# Patient Record
Sex: Female | Born: 1970 | Race: Asian | Hispanic: No | Marital: Married | Smoking: Never smoker
Health system: Southern US, Community
[De-identification: ages and names within clinical notes are randomized; demographics above are authoritative.]

## PROBLEM LIST (undated history)

## (undated) ENCOUNTER — Inpatient Hospital Stay (HOSPITAL_COMMUNITY): Payer: Self-pay

## (undated) DIAGNOSIS — A749 Chlamydial infection, unspecified: Secondary | ICD-10-CM

## (undated) DIAGNOSIS — O98819 Other maternal infectious and parasitic diseases complicating pregnancy, unspecified trimester: Secondary | ICD-10-CM

## (undated) HISTORY — PX: INCISION AND DRAINAGE: SHX5863

## (undated) HISTORY — PX: OTHER SURGICAL HISTORY: SHX169

## (undated) HISTORY — PX: THYROIDECTOMY: SHX17

## (undated) HISTORY — PX: TONSILLECTOMY: SUR1361

---

## 2016-11-21 NOTE — L&D Delivery Note (Signed)
Delivery Note Pt pushed well and at 6:21 PM a viable female was delivered via Vaginal, Spontaneous Delivery (Presentation: ROA).  APGAR: 8, 9; weight: pending. Prior to delivery and as the vtx was crowning, deinfibulation was performed x approx 3-4cm using straight scissors and epidural for anesthesia; delivery effected soon after. Cord was around the shoulder at delivery; reduced, and infant placed on pt's abd and dried. Bulb suctioned mouth for sm amt of meconium secretions. Cord clamped and cut. Hospital cord blood sample collected. Placenta status: spont , intact .  Cord: 3 vessels; cord with marginal insertion approx 0.5cm from border of placenta.  Anesthesia:  Epidural Episiotomy: deinfibulation repaired in a running fashion and reapproximated with subcuticular sutures Lacerations: None Suture Repair: 3.0 monocryl Est. Blood Loss (mL): 150  Mom to postpartum.  Baby to Couplet care / Skin to Skin.  Cam HaiSHAW, Haidyn Kilburg CNM 09/20/2017, 7:30 PM  Please schedule this patient for PP visit in: 4 weeks Low risk pregnancy complicated by: AMA Delivery mode:  SVD Anticipated Birth Control:  POPs PP Procedures needed: none  Schedule Integrated BH visit: no Provider: Any provider

## 2017-02-22 ENCOUNTER — Ambulatory Visit (INDEPENDENT_AMBULATORY_CARE_PROVIDER_SITE_OTHER): Payer: BLUE CROSS/BLUE SHIELD | Admitting: Physician Assistant

## 2017-02-22 VITALS — BP 102/70 | HR 79 | Temp 98.7°F | Resp 16 | Ht 62.0 in | Wt 146.6 lb

## 2017-02-22 DIAGNOSIS — Z3201 Encounter for pregnancy test, result positive: Secondary | ICD-10-CM

## 2017-02-22 DIAGNOSIS — K5900 Constipation, unspecified: Secondary | ICD-10-CM

## 2017-02-22 DIAGNOSIS — N3001 Acute cystitis with hematuria: Secondary | ICD-10-CM | POA: Diagnosis not present

## 2017-02-22 DIAGNOSIS — R1030 Lower abdominal pain, unspecified: Secondary | ICD-10-CM

## 2017-02-22 DIAGNOSIS — R112 Nausea with vomiting, unspecified: Secondary | ICD-10-CM

## 2017-02-22 LAB — POCT URINALYSIS DIP (MANUAL ENTRY)
Bilirubin, UA: NEGATIVE
Glucose, UA: NEGATIVE
Ketones, POC UA: NEGATIVE
Nitrite, UA: NEGATIVE
Spec Grav, UA: 1.03 (ref 1.030–1.035)
Urobilinogen, UA: 0.2 (ref ?–2.0)
pH, UA: 5 (ref 5.0–8.0)

## 2017-02-22 LAB — POCT CBC
Granulocyte percent: 63.1 %G (ref 37–80)
HCT, POC: 41.3 % (ref 37.7–47.9)
Hemoglobin: 14.1 g/dL (ref 12.2–16.2)
Lymph, poc: 3 (ref 0.6–3.4)
MCH, POC: 29.3 pg (ref 27–31.2)
MCHC: 34 g/dL (ref 31.8–35.4)
MCV: 86.2 fL (ref 80–97)
MID (cbc): 0.1 (ref 0–0.9)
MPV: 8.1 fL (ref 0–99.8)
POC Granulocyte: 5.2 (ref 2–6.9)
POC LYMPH PERCENT: 36.1 % (ref 10–50)
POC MID %: 0.8 % (ref 0–12)
Platelet Count, POC: 263 10*3/uL (ref 142–424)
RBC: 4.8 M/uL (ref 4.04–5.48)
RDW, POC: 12.8 %
WBC: 8.2 10*3/uL (ref 4.6–10.2)

## 2017-02-22 LAB — POC MICROSCOPIC URINALYSIS (UMFC): Mucus: ABSENT

## 2017-02-22 LAB — POCT URINE PREGNANCY: Preg Test, Ur: POSITIVE — AB

## 2017-02-22 MED ORDER — NITROFURANTOIN MONOHYD MACRO 100 MG PO CAPS: 100.0000 mg | ORAL_CAPSULE | Freq: Two times a day (BID) | ORAL | 0 refills | Status: DC

## 2017-02-22 NOTE — Progress Notes (Signed)
Danielle Bridges  MRN: [id number] DOB: [birth year]  PCP: No PCP Per Patient  Subjective:  Pt is a 46 year old female who presents to clinic for abdominal pain x 2 weeks. She speaks Arabic - mobile interpreter used today.   Located lower abdomen. Pain is during the day and is worse at night. Described as "severe".  Started as vomiting and abdominal pain. Last episode of vomiting was 3 days ago. 1-2 episodes of vomiting.   Decreased appetite. When she eats, she feels like she will vomit.  Her bowel movements are "normal". Last BM last night. No blood in stool. She does not have to strain with BM. She has not taken anything to feel better.  LMP Feb 8. No abnormal vaginal bleeding or discharge.   Urinary changes - "not clean". Dark yellow with thick secretions.  "sometimes" fever and chills. Denies flank pain, urinary frequency, urgency and dysuria, or abnormal vaginal discharge or bleeding, vaginal cramping, back pain.  She still has her appendix.   Review of Systems  Constitutional: Positive for chills and fever. Negative for fatigue.  Respiratory: Negative for cough, shortness of breath and wheezing.   Cardiovascular: Negative for chest pain and palpitations.  Gastrointestinal: Positive for abdominal pain and vomiting. Negative for diarrhea and nausea.  Genitourinary: Negative for decreased urine volume, difficulty urinating, dysuria, enuresis, flank pain, frequency, hematuria and urgency.  Musculoskeletal: Negative for back pain.  Neurological: Negative for dizziness, weakness, light-headedness and headaches.    There are no active problems to display for this patient.   No current outpatient prescriptions on file prior to visit.   No current facility-administered medications on file prior to visit.     No Known Allergies   Objective:  BP 102/70 (BP Location: Right Arm, Patient Position: Sitting, Cuff Size: Small)   Pulse 79   Temp 98.7 F (37.1 C) (Oral)   Resp 16   Ht 5'  2" (1.575 m)   Wt 146 lb 9.6 oz (66.5 kg)   LMP 12/23/2016   SpO2 98%   BMI 26.81 kg/m   Physical Exam  Constitutional: She is oriented to person, place, and time and well-developed, well-nourished, and in no distress. No distress.  Cardiovascular: Normal rate, regular rhythm and normal heart sounds.   Abdominal: Soft. Normal appearance and bowel sounds are normal. There is generalized tenderness. There is no rigidity, no rebound, no guarding, no CVA tenderness, no tenderness at McBurney's point and negative Murphy's sign.  Neurological: She is alert and oriented to person, place, and time. GCS score is 15.  Skin: Skin is warm and dry.  Psychiatric: Mood, memory, affect and judgment normal.  Vitals reviewed.  Results for orders placed or performed in visit on 02/22/17  POCT Microscopic Urinalysis ([facility])  Result Value Ref Range   WBC,UR,HPF,POC Moderate (A) None WBC/hpf   RBC,UR,HPF,POC None None RBC/hpf   Bacteria Many (A) None, Too numerous to count   Mucus Absent Absent   Epithelial Cells, UR Per Microscopy Moderate (A) None, Too numerous to count cells/hpf  POCT urinalysis dipstick  Result Value Ref Range   Color, UA yellow yellow   Clarity, UA cloudy (A) clear   Glucose, UA negative negative   Bilirubin, UA negative negative   Ketones, POC UA negative negative   Spec Grav, UA 1.030 1.030 - 1.035   Blood, UA moderate (A) negative   pH, UA 5.0 5.0 - 8.0   Protein Ur, POC trace (A) negative   Urobilinogen, UA 0.2  Negative - 2.0   Nitrite, UA Negative Negative   Leukocytes, UA small (1+) (A) Negative  POCT urine pregnancy  Result Value Ref Range   Preg Test, Ur Positive (A) Negative    Assessment and Plan :  1. Positive pregnancy test 2. Nausea and vomiting, intractability of vomiting not specified, unspecified vomiting type 3. Lower abdominal pain - Ambulatory referral to Obstetrics / Gynecology - Urine culture - POCT Microscopic Urinalysis ([facility]) - POCT urinalysis  dipstick - POCT CBC - CMP14+EGFR - POCT urine pregnancy - Pt is positive for pregnancy. Discussed these results through interpreter. Suspect nausea due to pregnancy. Will refer to OB-GYN for f/u. Advised folic acid and increase water intake daily.   4. Acute cystitis with hematuria  - nitrofurantoin, macrocrystal-monohydrate, (MACROBID) 100 MG capsule; Take 1 capsule (100 mg total) by mouth 2 (two) times daily.  Dispense: 20 capsule; Refill: 0 - Culture is pending. Will treat for UTI. RTC if symptoms do not improve.  5. Constipation - supportive care: Push fluids, add fiber and exercise to daily routine.    Mercer Pod, PA-C  [facility] [place] 02/22/2017 12:50 PM

## 2017-02-22 NOTE — Patient Instructions (Addendum)
Your pregnancy test is positive. I referred you to [facility] for follow-up. Please schedule an appointment when they call.  See below for constipation relief.   Take 1 mg Folic acid daily. Limit alcohol. Continue usual activities, including sports. Make sure you are eating a well balanced diet and drinking 64 oz water every day.  Enjoy having sex, as stress can make conception more difficult. Consider What to Expect When You're Expecting or Your Pregnancy Week by Week, both of these have sections on planning your next pregnancy.   Constipation, Adult Constipation is when a person has fewer bowel movements in a week than normal, has difficulty having a bowel movement, or has stools that are dry, hard, or larger than normal. Constipation may be caused by an underlying condition. It may become worse with age if a person takes certain medicines and does not take in enough fluids. Follow these instructions at home: Eating and drinking    Eat foods that have a lot of fiber, such as fresh fruits and vegetables, whole grains, and beans.  Limit foods that are high in fat, low in fiber, or overly processed, such as french fries, hamburgers, cookies, candies, and soda.  Drink enough fluid to keep your urine clear or pale yellow. General instructions   Exercise regularly or as told by your health care provider.  Go to the restroom when you have the urge to go. Do not hold it in.  Take over-the-counter and prescription medicines only as told by your health care provider. These include any fiber supplements.  Practice pelvic floor retraining exercises, such as deep breathing while relaxing the lower abdomen and pelvic floor relaxation during bowel movements.  Watch your condition for any changes.  Keep all follow-up visits as told by your health care provider. This is important. Contact a health care provider if:  You have pain that gets worse.  You have a fever.  You do not have a bowel movement  after 4 days.  You vomit.  You are not hungry.  You lose weight.  You are bleeding from the anus.  You have thin, pencil-like stools. Get help right away if:  You have a fever and your symptoms suddenly get worse.  You leak stool or have blood in your stool.  Your abdomen is bloated.  You have severe pain in your abdomen.  You feel dizzy or you faint. This information is not intended to replace advice given to you by your health care provider. Make sure you discuss any questions you have with your health care provider. Document Released: 08/05/2004 Document Revised: 05/27/2016 Document Reviewed: 04/27/2016 Elsevier Interactive Patient Education  2017 [place] you for coming in today. I hope you feel we met your needs.  Feel free to call [facility] if you have any questions or further requests.  Please consider signing up for MyChart if you do not already have it, as this is a great way to communicate with me.  Best,  Whitney McVey, PA-C   IF you received an x-ray today, you will receive an invoice from [facility] Radiology. Please contact [facility] at [phone] with questions or concerns regarding your invoice.   IF you received labwork today, you will receive an invoice from [facility]. Please contact [facility] at [phone] with questions or concerns regarding your invoice.   Our billing staff will not be able to assist you with questions regarding bills from these companies.  You will be contacted with the lab results as  soon as they are available. The fastest way to get your results is to activate your My Chart account. Instructions are located on the last page of this paperwork. If you have not heard from [place] regarding the results in 2 weeks, please contact this office.

## 2017-02-23 LAB — CMP14+EGFR
ALT: 10 IU/L (ref 0–32)
AST: 11 IU/L (ref 0–40)
Albumin/Globulin Ratio: 1.5 (ref 1.2–2.2)
Albumin: 3.7 g/dL (ref 3.5–5.5)
Alkaline Phosphatase: 50 IU/L (ref 39–117)
BUN/Creatinine Ratio: 8 — ABNORMAL LOW (ref 9–23)
BUN: 5 mg/dL — ABNORMAL LOW (ref 6–24)
Bilirubin Total: 0.2 mg/dL (ref 0.0–1.2)
CO2: 21 mmol/L (ref 18–29)
Calcium: 9.2 mg/dL (ref 8.7–10.2)
Chloride: 105 mmol/L (ref 96–106)
Creatinine, Ser: 0.65 mg/dL (ref 0.57–1.00)
GFR calc Af Amer: 126 mL/min/{1.73_m2} (ref >59)
GFR calc non Af Amer: 109 mL/min/{1.73_m2} (ref >59)
Globulin, Total: 2.4 g/dL (ref 1.5–4.5)
Glucose: 83 mg/dL (ref 65–99)
Potassium: 4.2 mmol/L (ref 3.5–5.2)
Sodium: 139 mmol/L (ref 134–144)
Total Protein: 6.1 g/dL (ref 6.0–8.5)

## 2017-02-24 LAB — URINE CULTURE

## 2017-03-01 ENCOUNTER — Encounter: Payer: Self-pay | Admitting: Medical

## 2017-03-25 ENCOUNTER — Encounter (HOSPITAL_COMMUNITY): Payer: Self-pay

## 2017-03-25 ENCOUNTER — Inpatient Hospital Stay (HOSPITAL_COMMUNITY)
Admission: AD | Admit: 2017-03-25 | Discharge: 2017-03-26 | Disposition: A | Discharge status: Home / Self Care | Payer: Medicaid Other | Source: Ambulatory Visit | Attending: Obstetrics and Gynecology | Admitting: Obstetrics and Gynecology

## 2017-03-25 DIAGNOSIS — J029 Acute pharyngitis, unspecified: Secondary | ICD-10-CM | POA: Diagnosis not present

## 2017-03-25 DIAGNOSIS — J00 Acute nasopharyngitis [common cold]: Secondary | ICD-10-CM

## 2017-03-25 DIAGNOSIS — Z3A12 12 weeks gestation of pregnancy: Secondary | ICD-10-CM | POA: Diagnosis present

## 2017-03-25 DIAGNOSIS — O99511 Diseases of the respiratory system complicating pregnancy, first trimester: Secondary | ICD-10-CM | POA: Diagnosis not present

## 2017-03-25 DIAGNOSIS — O26891 Other specified pregnancy related conditions, first trimester: Secondary | ICD-10-CM | POA: Diagnosis not present

## 2017-03-25 DIAGNOSIS — M545 Low back pain: Secondary | ICD-10-CM | POA: Diagnosis not present

## 2017-03-25 DIAGNOSIS — Z9889 Other specified postprocedural states: Secondary | ICD-10-CM | POA: Diagnosis not present

## 2017-03-25 DIAGNOSIS — N3001 Acute cystitis with hematuria: Secondary | ICD-10-CM

## 2017-03-25 DIAGNOSIS — R0602 Shortness of breath: Secondary | ICD-10-CM | POA: Diagnosis not present

## 2017-03-25 DIAGNOSIS — R51 Headache: Secondary | ICD-10-CM | POA: Insufficient documentation

## 2017-03-25 DIAGNOSIS — O219 Vomiting of pregnancy, unspecified: Secondary | ICD-10-CM | POA: Insufficient documentation

## 2017-03-25 DIAGNOSIS — Z331 Pregnant state, incidental: Secondary | ICD-10-CM | POA: Diagnosis not present

## 2017-03-25 DIAGNOSIS — O2311 Infections of bladder in pregnancy, first trimester: Secondary | ICD-10-CM | POA: Diagnosis not present

## 2017-03-25 LAB — URINALYSIS, ROUTINE W REFLEX MICROSCOPIC
Bilirubin Urine: NEGATIVE
GLUCOSE, UA: 50 mg/dL — AB
KETONES UR: 20 mg/dL — AB
NITRITE: NEGATIVE
PROTEIN: NEGATIVE mg/dL
Specific Gravity, Urine: 1.017 (ref 1.005–1.030)
pH: 5 (ref 5.0–8.0)

## 2017-03-25 MED ORDER — PROMETHAZINE HCL 25 MG PO TABS
25.0000 mg | ORAL_TABLET | Freq: Once | ORAL | Status: AC
  Administered 2017-03-25: 25 mg via ORAL
  Filled 2017-03-25: qty 1

## 2017-03-25 MED ORDER — PSEUDOEPHEDRINE HCL 30 MG PO TABS
60.0000 mg | ORAL_TABLET | Freq: Once | ORAL | Status: AC
  Administered 2017-03-25: 60 mg via ORAL
  Filled 2017-03-25: qty 2

## 2017-03-25 MED ORDER — ACETAMINOPHEN 500 MG PO TABS
1000.0000 mg | ORAL_TABLET | Freq: Once | ORAL | Status: AC
  Administered 2017-03-25: 1000 mg via ORAL
  Filled 2017-03-25: qty 2

## 2017-03-25 NOTE — MAU Note (Signed)
Period didn't come so took home UPT on April 4th, went to [facility] and it was positive.  Started having  shortnessof breath and having pain in lower back. Feels congested in throat and nose and has headache since Thursday. Lower abd pain since beginnng of pregnancy like stomach ache, no abd pain today.  Vomits once a day. Has doctor appt on May 9th to start care, doesn't know name of place. No vaginal bleeding.

## 2017-03-25 NOTE — Progress Notes (Addendum)
G4P3 @ approx 12.[redacted] wksga. Presents to triage for SOB, back pain, and sinus congestion. Symptoms started on Thursday. Did not take any medications. Been able to take fluids and eat. Threw up once this morning only. denies bleeding or vaginal discharges.   Doppler 162 VSS see flow sheet for details.   2257: Provider at bs assessing pt. Arabic interpreter called for assistance.   Influenza test ordered.   2314: reported off to [employer] [facility].

## 2017-03-25 NOTE — MAU Provider Note (Signed)
Chief Complaint: congestion, SOB, HA   First Provider Initiated Contact with Patient 03/25/17 2255     SUBJECTIVE HPI: Danielle Bridges is a 46 y.o. G4P3003 at 5178w2d who presents to [facility] reporting nasal congestion, sore throat, chills, headache, low back pain, shortness of breath since Thursday, 03/23/2017. Hasn't taken anything for the symptoms. Denies sick contacts. He was diagnosed with UTI 02/22/2017. Was prescribed Macrobid, but couldn't take the medication due to nausea and vomiting of pregnancy. Reports she still has nausea and vomits 1 time per day. Denies dysuria, hematuria, urgency, frequency. Has not checked temperature.  Location: Generalized headache and low back pain Quality: Throbbing Severity: Moderate Duration: 2 days Context: Symptoms of respiratory infection Timing: Constant Modifying factors: None. Hasn't tried anything for the pain Associated signs and symptoms: Positive for chills, nasal congestion, sore throat. Negative for urinary complaints, flank pain.  History reviewed. No pertinent past medical history. OB History  Gravida Para Term Preterm AB Living  4 3 3     3   SAB TAB Ectopic Multiple Live Births          3    # Outcome Date GA Lbr Len/2nd Weight Sex Delivery Anes PTL Lv  4 Current           3 Term      Vag-Spont   LIV  2 Term      Vag-Spont   LIV  1 Term      Vag-Spont   LIV     Past Surgical History:  Procedure Laterality Date  . THYROIDECTOMY Bilateral    Social History   Social History  . Marital status: Married    Spouse name: N/A  . Number of children: N/A  . Years of education: N/A   Occupational History  . Not on file.   Social History Main Topics  . Smoking status: Never Smoker  . Smokeless tobacco: Never Used  . Alcohol use No  . Drug use: No  . Sexual activity: Yes     Comment: married   Other Topics Concern  . Not on file   Social History Narrative  . No narrative on file   Family History  Problem  Relation Age of Onset  . Hypertension Mother    No current facility-administered medications on file prior to encounter.    No current outpatient prescriptions on file prior to encounter.   No Known Allergies  I have reviewed patient's Past Medical Hx, Surgical Hx, Family Hx, Social Hx, medications and allergies.   Review of Systems  Constitutional: Positive for chills and fatigue. Negative for appetite change.  HENT: Positive for congestion and rhinorrhea.   Eyes: Negative for visual disturbance.  Respiratory: Positive for shortness of breath. Negative for cough, chest tightness and wheezing.   Gastrointestinal: Positive for nausea and vomiting. Negative for abdominal pain.  Genitourinary: Negative for dysuria, flank pain, frequency, hematuria, urgency and vaginal bleeding.  Musculoskeletal: Positive for back pain and myalgias. Negative for neck pain.  Neurological: Positive for headaches. Negative for speech difficulty.    OBJECTIVE Patient Vitals for the past 24 hrs:  BP Temp Temp src Pulse Resp SpO2 Height Weight  03/25/17 2205 117/64 98.7 F (37.1 C) Oral (!) 109 18 99 % 5\' 3"  (1.6 m) 143 lb 12 oz (65.2 kg)   Constitutional: Well-developed, well-nourished female in mild distress. Appears older than stated age. Mildly ill-appearing. Cardiovascular: Mild tachycardia. Regular rhythm. No murmurs rubs or gallops. Respiratory: normal rate and effort. Clear to  auscultation bilaterally. GI: Abd soft, non-tender. MS: Extremities nontender, no edema, normal ROM. Entire back mildly tender. Normal range of motion. Neurologic: Alert and oriented x 4.  GU: Neg CVAT.  SPECULUM EXAM: Deferred  LAB RESULTS Results for orders placed or performed during the hospital encounter of 03/25/17 (from the past 24 hour(s))  Urinalysis, Routine w reflex microscopic     Status: Abnormal   Collection Time: 03/25/17 10:08 PM  Result Value Ref Range   Color, Urine YELLOW YELLOW   APPearance HAZY (A)  CLEAR   Specific Gravity, Urine 1.017 1.005 - 1.030   pH 5.0 5.0 - 8.0   Glucose, UA 50 (A) NEGATIVE mg/dL   Hgb urine dipstick MODERATE (A) NEGATIVE   Bilirubin Urine NEGATIVE NEGATIVE   Ketones, ur 20 (A) NEGATIVE mg/dL   Protein, ur NEGATIVE NEGATIVE mg/dL   Nitrite NEGATIVE NEGATIVE   Leukocytes, UA SMALL (A) NEGATIVE   RBC / HPF 0-5 0 - 5 RBC/hpf   WBC, UA 6-30 0 - 5 WBC/hpf   Bacteria, UA MANY (A) NONE SEEN   Squamous Epithelial / LPF 6-30 (A) NONE SEEN   Mucous PRESENT    Hyaline Casts, UA PRESENT   Influenza panel by PCR (type A & B)     Status: None   Collection Time: 03/25/17 11:30 PM  Result Value Ref Range   Influenza A By PCR NEGATIVE NEGATIVE   Influenza B By PCR NEGATIVE NEGATIVE    IMAGING No results found.  MAU COURSE Orders Placed This Encounter  Procedures  . Culture, OB Urine  . Urinalysis, Routine w reflex microscopic  . Influenza panel by PCR (type A & B)  . Droplet precaution  . Discharge patient   Meds ordered this encounter  Medications  . OVER THE COUNTER MEDICATION    Sig: Folic acid daily.  . pseudoephedrine (SUDAFED) tablet 60 mg  . acetaminophen (TYLENOL) tablet 1,000 mg  . promethazine (PHENERGAN) tablet 25 mg  . nitrofurantoin, macrocrystal-monohydrate, (MACROBID) 100 MG capsule    Sig: Take 1 capsule (100 mg total) by mouth 2 (two) times daily.    Dispense:  14 capsule    Refill:  0    Order Specific Question:   Supervising Provider    Answer:   Tilda Burrow [[id number]]  . Prenat w/o A Vit-FeFum-FePo-FA (CONCEPT OB) 130-92.4-1 MG CAPS    Sig: Take 1 tablet by mouth daily.    Dispense:  30 capsule    Refill:  12    Order Specific Question:   Supervising Provider    Answer:   Tilda Burrow [[id number]]   Feeling much better after Tylenol and Sudafed. Influenza test negative.  MDM - Respiratory complaints due to viral URI. No evidence of influenza or emergent condition. Feeling much better after Sudafed and Tylenol. - UA still  suggestive of UTI. Patient's low back pain is not consistent with CVA tenderness. Low suspicion for pyelonephritis. We'll treat with Macrobid and give Rx Phenergan so the patient can keep down her antibiotic. - Nausea and vomiting of pregnancy controlled by Phenergan.  ASSESSMENT 1. Acute nasopharyngitis   2. Pregnancy, incidental   3. Acute cystitis with hematuria   4.      Nausea and vomiting of pregnancy  PLAN Discharge home in stable condition. URI and second trimester precautions Pilot precautions. Increase fluids and rest Urine culture pending. Follow-up Information    [facility] Follow up on 03/29/2017.   Specialty:  Obstetrics and Gynecology  Why:  Start prenatal care Contact information: [address] [place] [address] [phone]       [facility] MATERNITY ADMISSIONS Follow up.   Why:  For pregnancy emergencies Contact information: [address] [id number] mc [place] [address] [phone]       [facility] EMERGENCY DEPARTMENT Follow up.   Specialty:  Emergency Medicine Why:  For emergencies not related to pregnancy Contact information: [address] [id number] mc [place] [address] [phone]         Allergies as of 03/26/2017   No Known Allergies     Medication List    TAKE these medications   CONCEPT OB 130-92.4-1 MG Caps Take 1 tablet by mouth daily.   nitrofurantoin (macrocrystal-monohydrate) 100 MG capsule Commonly known as:  MACROBID Take 1 capsule (100 mg total) by mouth 2 (two) times daily.   OVER THE COUNTER MEDICATION Folic acid daily.        Katrinka Blazing, [place], [place] 03/26/2017  12:37 AM

## 2017-03-26 DIAGNOSIS — J00 Acute nasopharyngitis [common cold]: Secondary | ICD-10-CM

## 2017-03-26 DIAGNOSIS — N3001 Acute cystitis with hematuria: Secondary | ICD-10-CM

## 2017-03-26 DIAGNOSIS — Z331 Pregnant state, incidental: Secondary | ICD-10-CM

## 2017-03-26 LAB — INFLUENZA PANEL BY PCR (TYPE A & B)
INFLAPCR: NEGATIVE
INFLBPCR: NEGATIVE

## 2017-03-26 MED ORDER — PROMETHAZINE HCL 25 MG PO TABS: 25.0000 mg | ORAL_TABLET | Freq: Four times a day (QID) | ORAL | 1 refills | Status: DC | PRN

## 2017-03-26 MED ORDER — NITROFURANTOIN MONOHYD MACRO 100 MG PO CAPS: 100.0000 mg | ORAL_CAPSULE | Freq: Two times a day (BID) | ORAL | 0 refills | Status: DC

## 2017-03-26 MED ORDER — CONCEPT OB 130-92.4-1 MG PO CAPS: 1.0000 | ORAL_CAPSULE | Freq: Every day | ORAL | 12 refills | Status: DC

## 2017-03-26 NOTE — Discharge Instructions (Signed)
Upper Respiratory Infection, Adult Most upper respiratory infections (URIs) are a viral infection of the air passages leading to the lungs. A URI affects the nose, throat, and upper air passages. The most common type of URI is nasopharyngitis and is typically referred to as "the common cold." URIs run their course and usually go away on their own. Most of the time, a URI does not require medical attention, but sometimes a bacterial infection in the upper airways can follow a viral infection. This is called a secondary infection. Sinus and middle ear infections are common types of secondary upper respiratory infections. Bacterial pneumonia can also complicate a URI. A URI can worsen asthma and chronic obstructive pulmonary disease (COPD). Sometimes, these complications can require emergency medical care and may be life threatening. What are the causes? Almost all URIs are caused by viruses. A virus is a type of germ and can spread from one person to another. What increases the risk? You may be at risk for a URI if:  You smoke.  You have chronic heart or lung disease.  You have a weakened defense (immune) system.  You are very young or very old.  You have nasal allergies or asthma.  You work in crowded or poorly ventilated areas.  You work in health care facilities or schools. What are the signs or symptoms? Symptoms typically develop 2-3 days after you come in contact with a cold virus. Most viral URIs last 7-10 days. However, viral URIs from the influenza virus (flu virus) can last 14-18 days and are typically more severe. Symptoms may include:  Runny or stuffy (congested) nose.  Sneezing.  Cough.  Sore throat.  Headache.  Fatigue.  Fever.  Loss of appetite.  Pain in your forehead, behind your eyes, and over your cheekbones (sinus pain).  Muscle aches. How is this diagnosed? Your health care provider may diagnose a URI by:  Physical exam.  Tests to check that your  symptoms are not due to another condition such as:  Strep throat.  Sinusitis.  Pneumonia.  Asthma. How is this treated? A URI goes away on its own with time. It cannot be cured with medicines, but medicines may be prescribed or recommended to relieve symptoms. Medicines may help:  Reduce your fever.  Reduce your cough.  Relieve nasal congestion. Follow these instructions at home:  Take medicines only as directed by your health care provider.  Gargle warm saltwater or take cough drops to comfort your throat as directed by your health care provider.  Use a warm mist humidifier or inhale steam from a shower to increase air moisture. This may make it easier to breathe.  Drink enough fluid to keep your urine clear or pale yellow.  Eat soups and other clear broths and maintain good nutrition.  Rest as needed.  Return to work when your temperature has returned to normal or as your health care provider advises. You may need to stay home longer to avoid infecting others. You can also use a face mask and careful hand washing to prevent spread of the virus.  Increase the usage of your inhaler if you have asthma.  Do not use any tobacco products, including cigarettes, chewing tobacco, or electronic cigarettes. If you need help quitting, ask your health care provider. How is this prevented? The best way to protect yourself from getting a cold is to practice good hygiene.  Avoid oral or hand contact with people with cold symptoms.  Wash your hands often if contact  occurs. There is no clear evidence that vitamin C, vitamin E, echinacea, or exercise reduces the chance of developing a cold. However, it is always recommended to get plenty of rest, exercise, and practice good nutrition. Contact a health care provider if:  You are getting worse rather than better.  Your symptoms are not controlled by medicine.  You have chills.  You have worsening shortness of breath.  You have brown  or red mucus.  You have yellow or brown nasal discharge.  You have pain in your face, especially when you bend forward.  You have a fever.  You have swollen neck glands.  You have pain while swallowing.  You have white areas in the back of your throat. Get help right away if:  You have severe or persistent:  Headache.  Ear pain.  Sinus pain.  Chest pain.  You have chronic lung disease and any of the following:  Wheezing.  Prolonged cough.  Coughing up blood.  A change in your usual mucus.  You have a stiff neck.  You have changes in your:  Vision.  Hearing.  Thinking.  Mood. This information is not intended to replace advice given to you by your health care provider. Make sure you discuss any questions you have with your health care provider. Document Released: 05/03/2001 Document Revised: 07/10/2016 Document Reviewed: 02/12/2014 Elsevier Interactive Patient Education  2017 [employer]   Prenatal Care WHAT IS PRENATAL CARE? Prenatal care is the process of caring for a pregnant woman before she gives birth. Prenatal care makes sure that she and her baby remain as healthy as possible throughout pregnancy. Prenatal care may be provided by a midwife, family practice health care provider, or a childbirth and pregnancy specialist (obstetrician). Prenatal care may include physical examinations, testing, treatments, and education on nutrition, lifestyle, and social support services. WHY IS PRENATAL CARE SO IMPORTANT? Early and consistent prenatal care increases the chance that you and your baby will remain healthy throughout your pregnancy. This type of care also decreases a babys risk of being born too early (prematurely), or being born smaller than expected (small for gestational age). Any underlying medical conditions you may have that could pose a risk during your pregnancy are discussed during prenatal care visits. You will also be monitored regularly for any  new conditions that may arise during your pregnancy so they can be treated quickly and effectively. WHAT HAPPENS DURING PRENATAL CARE VISITS? Prenatal care visits may include the following: Discussion Tell your health care provider about any new signs or symptoms you have experienced since your last visit. These might include:  Nausea or vomiting.  Increased or decreased level of energy.  Difficulty sleeping.  Back or leg pain.  Weight changes.  Frequent urination.  Shortness of breath with physical activity.  Changes in your skin, such as the development of a rash or itchiness.  Vaginal discharge or bleeding.  Feelings of excitement or nervousness.  Changes in your babys movements. You may want to write down any questions or topics you want to discuss with your health care provider and bring them with you to your appointment. Examination During your first prenatal care visit, you will likely have a complete physical exam. Your health care provider will often examine your vagina, cervix, and the position of your uterus, as well as check your heart, lungs, and other body systems. As your pregnancy progresses, your health care provider will measure the size of your uterus and your babys position inside your  uterus. He or she may also examine you for early signs of labor. Your prenatal visits may also include checking your blood pressure and, after about 10-12 weeks of pregnancy, listening to your babys heartbeat. Testing Regular testing often includes:  Urinalysis. This checks your urine for glucose, protein, or signs of infection.  Blood count. This checks the levels of white and red blood cells in your body.  Tests for sexually transmitted infections (STIs). Testing for STIs at the beginning of pregnancy is routinely done and is required in many states.  Antibody testing. You will be checked to see if you are immune to certain illnesses, such as rubella, that can affect a  developing fetus.  Glucose screen. Around 24-28 weeks of pregnancy, your blood glucose level will be checked for signs of gestational diabetes. Follow-up tests may be recommended.  Group B strep. This is a bacteria that is commonly found inside a womans vagina. This test will inform your health care provider if you need an antibiotic to reduce the amount of this bacteria in your body prior to labor and childbirth.  Ultrasound. Many pregnant women undergo an ultrasound screening around 18-20 weeks of pregnancy to evaluate the health of the fetus and check for any developmental abnormalities.  HIV (human immunodeficiency virus) testing. Early in your pregnancy, you will be screened for HIV. If you are at high risk for HIV, this test may be repeated during your third trimester of pregnancy. You may be offered other testing based on your age, personal or family medical history, or other factors. HOW OFTEN SHOULD I PLAN TO SEE MY HEALTH CARE PROVIDER FOR PRENATAL CARE? Your prenatal care check-up schedule depends on any medical conditions you have before, or develop during, your pregnancy. If you do not have any underlying medical conditions, you will likely be seen for checkups:  Monthly, during the first 6 months of pregnancy.  Twice a month during months 7 and 8 of pregnancy.  Weekly starting in the 9th month of pregnancy and until delivery. If you develop signs of early labor or other concerning signs or symptoms, you may need to see your health care provider more often. Ask your health care provider what prenatal care schedule is best for you. WHAT CAN I DO TO KEEP MYSELF AND MY BABY AS HEALTHY AS POSSIBLE DURING MY PREGNANCY?  Take a prenatal vitamin containing 400 micrograms (0.4 mg) of folic acid every day. Your health care provider may also ask you to take additional vitamins such as iodine, vitamin D, iron, copper, and zinc.  Take 1500-2000 mg of calcium daily starting at your 20th week  of pregnancy until you deliver your baby.  Make sure you are up to date on your vaccinations. Unless directed otherwise by your health care provider:  You should receive a tetanus, diphtheria, and pertussis (Tdap) vaccination between the 27th and 36th week of your pregnancy, regardless of when your last Tdap immunization occurred. This helps protect your baby from whooping cough (pertussis) after he or she is born.  You should receive an annual inactivated influenza vaccine (IIV) to help protect you and your baby from influenza. This can be done at any point during your pregnancy.  Eat a well-rounded diet that includes:  Fresh fruits and vegetables.  Lean proteins.  Calcium-rich foods such as milk, yogurt, hard cheeses, and dark, leafy greens.  Whole grain breads.  Do noteat seafood high in mercury, including:  Swordfish.  Tilefish.  Shark.  King mackerel.  More  than 6 oz tuna per week.  Do not eat:  Raw or undercooked meats or eggs.  Unpasteurized foods, such as soft cheeses (brie, blue, or feta), juices, and milks.  Lunch meats.  Hot dogs that have not been heated until they are steaming.  Drink enough water to keep your urine clear or pale yellow. For many women, this may be 10 or more 8 oz glasses of water each day. Keeping yourself hydrated helps deliver nutrients to your baby and may prevent the start of pre-term uterine contractions.  Do not use any tobacco products including cigarettes, chewing tobacco, or electronic cigarettes. If you need help quitting, ask your health care provider.  Do not drink beverages containing alcohol. No safe level of alcohol consumption during pregnancy has been determined.  Do not use any illegal drugs. These can harm your developing baby or cause a miscarriage.  Ask your health care provider or pharmacist before taking any prescription or over-the-counter medicines, herbs, or supplements.  Limit your caffeine intake to no more  than 200 mg per day.  Exercise. Unless told otherwise by your health care provider, try to get 30 minutes of moderate exercise most days of the week. Do not  do high-impact activities, contact sports, or activities with a high risk of falling, such as horseback riding or downhill skiing.  Get plenty of rest.  Avoid anything that raises your body temperature, such as hot tubs and saunas.  If you own a cat, do not empty its litter box. Bacteria contained in cat feces can cause an infection called toxoplasmosis. This can result in serious harm to the fetus.  Stay away from chemicals such as insecticides, lead, mercury, and cleaning or paint products that contain solvents.  Do not have any X-rays taken unless medically necessary.  Take a childbirth and breastfeeding preparation class. Ask your health care provider if you need a referral or recommendation. This information is not intended to replace advice given to you by your health care provider. Make sure you discuss any questions you have with your health care provider. Document Released: 11/10/2003 Document Revised: 04/11/2016 Document Reviewed: 01/22/2014 Elsevier Interactive Patient Education  2017 [employer].

## 2017-03-27 LAB — CULTURE, OB URINE: Culture: 60000 — AB

## 2017-03-29 ENCOUNTER — Telehealth: Payer: Self-pay | Admitting: Medical

## 2017-03-29 ENCOUNTER — Encounter: Payer: Self-pay | Admitting: Medical

## 2017-03-29 NOTE — Telephone Encounter (Signed)
Interpreter used to call patient due to missed new ob appointment. Left message for patient to return call asap to reschedule missed appointment.

## 2017-04-12 ENCOUNTER — Encounter: Payer: Self-pay | Admitting: Advanced Practice Midwife

## 2017-04-12 ENCOUNTER — Ambulatory Visit (INDEPENDENT_AMBULATORY_CARE_PROVIDER_SITE_OTHER): Payer: Medicaid Other | Admitting: Advanced Practice Midwife

## 2017-04-12 VITALS — BP 113/64 | HR 86 | Wt 144.1 lb

## 2017-04-12 DIAGNOSIS — Z113 Encounter for screening for infections with a predominantly sexual mode of transmission: Secondary | ICD-10-CM | POA: Diagnosis not present

## 2017-04-12 DIAGNOSIS — Z3689 Encounter for other specified antenatal screening: Secondary | ICD-10-CM

## 2017-04-12 DIAGNOSIS — Z23 Encounter for immunization: Secondary | ICD-10-CM | POA: Diagnosis present

## 2017-04-12 DIAGNOSIS — Z124 Encounter for screening for malignant neoplasm of cervix: Secondary | ICD-10-CM

## 2017-04-12 DIAGNOSIS — Z603 Acculturation difficulty: Secondary | ICD-10-CM

## 2017-04-12 DIAGNOSIS — N90813 Female genital mutilation Type III status: Secondary | ICD-10-CM

## 2017-04-12 DIAGNOSIS — O0990 Supervision of high risk pregnancy, unspecified, unspecified trimester: Secondary | ICD-10-CM | POA: Insufficient documentation

## 2017-04-12 DIAGNOSIS — O0992 Supervision of high risk pregnancy, unspecified, second trimester: Secondary | ICD-10-CM

## 2017-04-12 DIAGNOSIS — O09522 Supervision of elderly multigravida, second trimester: Secondary | ICD-10-CM

## 2017-04-12 LAB — POCT URINALYSIS DIP (DEVICE)
Bilirubin Urine: NEGATIVE
Glucose, UA: NEGATIVE mg/dL
KETONES UR: NEGATIVE mg/dL
Nitrite: NEGATIVE
PH: 5.5 (ref 5.0–8.0)
PROTEIN: NEGATIVE mg/dL
Urobilinogen, UA: 0.2 mg/dL (ref 0.0–1.0)

## 2017-04-12 NOTE — Progress Notes (Signed)
  Subjective:    Danielle Bridges is a [id number] 3480w6d being seen today for her first obstetrical visit.  Her obstetrical history is significant for advanced maternal age and language barrier, and female circumcision. Patient does intend to breast feed. Pregnancy history fully reviewed.  Patient reports no complaints.  Interpretor present. Patient is quiet but answers all questions.   Requests to be cut and resutured at delivery (full infibulation)  Vitals:   04/12/17 1504  BP: 113/64  Pulse: 86  Weight: 144 lb 1.6 oz (65.4 kg)    HISTORY: OB History  Gravida Para Term Preterm AB Living  5 3 3   1 3   SAB TAB Ectopic Multiple Live Births  1       3    # Outcome Date GA Lbr Len/2nd Weight Sex Delivery Anes PTL Lv  5 Current           4 Term 10/29/07 6778w0d   F Vag-Spont None N LIV  3 Term 06/2003 8178w0d   F Vag-Spont None N LIV  2 Term 04/14/02 6178w0d   F Vag-Spont None N LIV  1 SAB              Past Medical History:  Diagnosis Date  . Medical history non-contributory    Past Surgical History:  Procedure Laterality Date  . INCISION AND DRAINAGE    . THYROIDECTOMY N/A    Patient has not had thyroid removed  . TONSILLECTOMY     Family History  Problem Relation Age of Onset  . Hypertension Mother      Exam    Uterus:     Pelvic Exam:    Perineum: No Hemorrhoids, no lesions.  There is evidence of infibulation.  Skin is infibulated from mons down to introitus.  There is a 3-4cm opening at introitus.  Was able to admit a Pederson speculum  (states was cut for delivery and re-sutured)   Vulva: perineum well healed, see above   Vagina:  normal mucosa, normal discharge   pH:    Cervix: multiparous appearance   Adnexa: no mass, fullness, tenderness   Bony Pelvis: gynecoid  System: Breast:  normal appearance, no masses or tenderness   Skin: normal coloration and turgor, no rashes    Neurologic: oriented, grossly non-focal   Extremities: normal strength, tone, and muscle mass    HEENT neck supple with midline trachea   Mouth/Teeth mucous membranes moist, pharynx normal without lesions   Neck supple   Cardiovascular: regular rate and rhythm   Respiratory:  appears well, vitals normal, no respiratory distress, acyanotic, normal RR, ear and throat exam is normal, neck free of mass or lymphadenopathy, chest clear, no wheezing, crepitations, rhonchi, normal symmetric air entry   Abdomen: soft, non-tender; bowel sounds normal; no masses,  no organomegaly   Urinary: Unable to see meatus due to infibulation      Assessment:    Pregnancy: [id number] Patient Active Problem List   Diagnosis Date Noted  . Supervision of high risk pregnancy, antepartum 04/12/2017        Plan:     Initial labs drawn. Prenatal vitamins. Problem list reviewed and updated. Genetic Screening discussed Quad Screen: requested.   Is self pay. Offered panorama, declined  Ultrasound discussed; fetal survey: requested.  Follow up in 4 weeks. 50% of 30 min visit spent on counseling and coordination of care.  Routines reviewed using in person interpretor Welcomed to practice   Danielle Bridges 04/12/2017

## 2017-04-12 NOTE — Progress Notes (Signed)
Live Arabic interpreter present for visit

## 2017-04-12 NOTE — Patient Instructions (Signed)

## 2017-04-13 LAB — OBSTETRIC PANEL, INCLUDING HIV
Antibody Screen: NEGATIVE
BASOS: 0 %
Basophils Absolute: 0 10*3/uL (ref 0.0–0.2)
EOS (ABSOLUTE): 0.1 10*3/uL (ref 0.0–0.4)
EOS: 2 %
HEMATOCRIT: 40.1 % (ref 34.0–46.6)
HEMOGLOBIN: 13.4 g/dL (ref 11.1–15.9)
HIV SCREEN 4TH GENERATION: NONREACTIVE
Hepatitis B Surface Ag: NEGATIVE
IMMATURE GRANS (ABS): 0 10*3/uL (ref 0.0–0.1)
Immature Granulocytes: 0 %
LYMPHS: 25 %
Lymphocytes Absolute: 2 10*3/uL (ref 0.7–3.1)
MCH: 28.8 pg (ref 26.6–33.0)
MCHC: 33.4 g/dL (ref 31.5–35.7)
MCV: 86 fL (ref 79–97)
MONOCYTES: 10 %
Monocytes Absolute: 0.8 10*3/uL (ref 0.1–0.9)
NEUTROS ABS: 5 10*3/uL (ref 1.4–7.0)
Neutrophils: 63 %
Platelets: 303 10*3/uL (ref 150–379)
RBC: 4.66 x10E6/uL (ref 3.77–5.28)
RDW: 13.5 % (ref 12.3–15.4)
RH TYPE: POSITIVE
RPR Ser Ql: NONREACTIVE
RUBELLA: 5.65 {index} (ref >0.99)
WBC: 8 10*3/uL (ref 3.4–10.8)

## 2017-04-13 LAB — HEMOGLOBINOPATHY EVALUATION
Ferritin: 67 ng/mL (ref 15–150)
HGB A: 96.8 % (ref 96.4–98.8)
HGB C: 0 %
HGB F QUANT: 0 % (ref 0.0–2.0)
HGB S: 0 %
HGB SOLUBILITY: NEGATIVE
Hgb A2 Quant: 3.2 % (ref 1.8–3.2)
Hgb Variant: 0 %

## 2017-04-14 LAB — CYTOLOGY - PAP
CHLAMYDIA, DNA PROBE: POSITIVE — AB
DIAGNOSIS: NEGATIVE
NEISSERIA GONORRHEA: NEGATIVE

## 2017-04-16 LAB — URINE CULTURE, OB REFLEX

## 2017-04-16 LAB — CULTURE, OB URINE

## 2017-04-18 ENCOUNTER — Encounter: Payer: Self-pay | Admitting: Obstetrics and Gynecology

## 2017-04-18 ENCOUNTER — Telehealth: Payer: Self-pay | Admitting: General Practice

## 2017-04-18 ENCOUNTER — Other Ambulatory Visit: Payer: Self-pay | Admitting: Obstetrics and Gynecology

## 2017-04-18 DIAGNOSIS — A749 Chlamydial infection, unspecified: Secondary | ICD-10-CM | POA: Insufficient documentation

## 2017-04-18 DIAGNOSIS — O98819 Other maternal infectious and parasitic diseases complicating pregnancy, unspecified trimester: Secondary | ICD-10-CM

## 2017-04-18 MED ORDER — AZITHROMYCIN 500 MG PO TABS: 1000.0000 mg | ORAL_TABLET | Freq: Once | ORAL | 1 refills | Status: AC

## 2017-04-18 NOTE — Telephone Encounter (Signed)
Per Dr Jolayne Pantheronstant, patient has chlamydia & needs treatment. Med has been sent to pharmacy & partner needs treatment as well- must abstain for 7 days after treatment. Std card completed. Called patient with pacific interpreter [phone], no answer- left message to call us back for urgent results.

## 2017-04-24 NOTE — Telephone Encounter (Signed)
[facility] Harriett Sineancy [phone] and left message stating that I am calling with important test result information.  Please call back for these results. [facility], pt obtained the prescription on 6/1.

## 2017-04-28 NOTE — Telephone Encounter (Signed)
Called patient with pacific interpreter [phone] & informed patient of results, medication, & recommendations. Patient verbalized understanding & had no questions

## 2017-05-09 NOTE — Addendum Note (Signed)
Addended by: Cheree DittoGRAHAM, DEMETRICE A on: 05/09/2017 03:55 PM   Modules accepted: Orders

## 2017-05-10 ENCOUNTER — Encounter: Payer: Self-pay | Admitting: Advanced Practice Midwife

## 2017-05-10 ENCOUNTER — Ambulatory Visit (INDEPENDENT_AMBULATORY_CARE_PROVIDER_SITE_OTHER): Payer: Medicaid Other | Admitting: Advanced Practice Midwife

## 2017-05-10 VITALS — BP 106/57 | HR 88 | Wt 145.2 lb

## 2017-05-10 DIAGNOSIS — A749 Chlamydial infection, unspecified: Secondary | ICD-10-CM

## 2017-05-10 DIAGNOSIS — M549 Dorsalgia, unspecified: Secondary | ICD-10-CM

## 2017-05-10 DIAGNOSIS — O99891 Other specified diseases and conditions complicating pregnancy: Secondary | ICD-10-CM

## 2017-05-10 DIAGNOSIS — O0990 Supervision of high risk pregnancy, unspecified, unspecified trimester: Secondary | ICD-10-CM

## 2017-05-10 DIAGNOSIS — O26892 Other specified pregnancy related conditions, second trimester: Secondary | ICD-10-CM

## 2017-05-10 DIAGNOSIS — Z603 Acculturation difficulty: Secondary | ICD-10-CM

## 2017-05-10 DIAGNOSIS — O9989 Other specified diseases and conditions complicating pregnancy, childbirth and the puerperium: Secondary | ICD-10-CM

## 2017-05-10 DIAGNOSIS — O98812 Other maternal infectious and parasitic diseases complicating pregnancy, second trimester: Secondary | ICD-10-CM

## 2017-05-10 DIAGNOSIS — O0992 Supervision of high risk pregnancy, unspecified, second trimester: Secondary | ICD-10-CM

## 2017-05-10 MED ORDER — ASPIRIN EC 81 MG PO TBEC: 81.0000 mg | DELAYED_RELEASE_TABLET | Freq: Every day | ORAL | 5 refills | Status: DC

## 2017-05-10 MED ORDER — CONCEPT OB 130-92.4-1 MG PO CAPS: 1.0000 | ORAL_CAPSULE | Freq: Every day | ORAL | 11 refills | Status: DC

## 2017-05-10 NOTE — Progress Notes (Deleted)
106/57 88 

## 2017-05-10 NOTE — Progress Notes (Signed)
   PRENATAL VISIT NOTE  Subjective:  Danielle Bridges is a 46 y.o. [id number] at 2281w6d being seen today for ongoing prenatal care.  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  Patient reports backache.  Contractions: Not present. Vag. Bleeding: None.  Movement: Present. Denies leaking of fluid.   The following portions of the patient's history were reviewed and updated as appropriate: allergies, current medications, past family history, past medical history, past social history, past surgical history and problem list. Problem list updated.  Objective:   Vitals:   05/10/17 1548  BP: (!) 106/57  Pulse: 88  Weight: 145 lb 3.2 oz (65.9 kg)    Fetal Status: Fetal Heart Rate (bpm): 149   Movement: Present     General:  Alert, oriented and cooperative. Patient is in no acute distress.  Skin: Skin is warm and dry. No rash noted.   Cardiovascular: Normal heart rate noted  Respiratory: Normal respiratory effort, no problems with respiration noted  Abdomen: Soft, gravid, appropriate for gestational age. Pain/Pressure: Present     Pelvic:  Cervical exam deferred        Extremities: Normal range of motion.  Edema: None  Mental Status: Normal mood and affect. Normal behavior. Normal judgment and thought content.   Assessment and Plan:  Pregnancy: [id number] at 4381w6d  1. Supervision of high risk pregnancy, antepartum -AMA over age 44  2. Language barrier, cultural differences --Arabic, video interpreter used with all communication  3. Back pain affecting pregnancy in second trimester --Mid-upper back pain, likely musculoskeletal. Rest/ice/heat/warm bath or shower/Tylenol for pain.  Preterm labor precautions reviewed.  4. Chlamydia infection  affecting pregnancy in second trimester --Diagnosed on 5/23 but pt took meds on 6/2 per her report today.   --Reports she took meds and no sexual activity since she was treated.  --Her partner does not know where to go to be treated so Expedited Partner Therapy Rx written for 1 female partner today, last name Ahmed.  --Pt needs TOC at next prenatal visit.    Preterm labor symptoms and general obstetric precautions including but not limited to vaginal bleeding, contractions, leaking of fluid and fetal movement were reviewed in detail with the patient. Please refer to After Visit Summary for other counseling recommendations.    Return in about 4 weeks (around 06/07/2017).  Sharen CounterLisa Leftwich-Kirby, CNM

## 2017-05-11 ENCOUNTER — Ambulatory Visit (HOSPITAL_COMMUNITY)
Admission: RE | Admit: 2017-05-11 | Discharge: 2017-05-11 | Disposition: A | Discharge status: Home / Self Care | Payer: Medicaid Other | Source: Ambulatory Visit | Attending: Advanced Practice Midwife | Admitting: Advanced Practice Midwife

## 2017-05-11 ENCOUNTER — Other Ambulatory Visit: Payer: Self-pay | Admitting: Advanced Practice Midwife

## 2017-05-11 DIAGNOSIS — O09522 Supervision of elderly multigravida, second trimester: Secondary | ICD-10-CM

## 2017-05-11 DIAGNOSIS — Z3A19 19 weeks gestation of pregnancy: Secondary | ICD-10-CM | POA: Diagnosis not present

## 2017-05-11 DIAGNOSIS — O09529 Supervision of elderly multigravida, unspecified trimester: Secondary | ICD-10-CM

## 2017-05-11 DIAGNOSIS — Z36 Encounter for antenatal screening for chromosomal anomalies: Secondary | ICD-10-CM | POA: Diagnosis present

## 2017-05-11 DIAGNOSIS — O0990 Supervision of high risk pregnancy, unspecified, unspecified trimester: Secondary | ICD-10-CM

## 2017-05-11 DIAGNOSIS — Z843 Family history of consanguinity: Secondary | ICD-10-CM

## 2017-05-11 NOTE — Progress Notes (Signed)
Genetic Counseling  High-Risk Gestation Note  Appointment Date:  05/11/2017 Referred By: Seabron Spates, CNM Date of Birth:  Mar 02, 1971   Pregnancy History: [id number] Estimated Date of Delivery: 10/05/17 Estimated Gestational Age: 44w0dAttending: MRenella Cunas MD  Danielle Bridges and her husband were seen for genetic counseling because of a maternal age of 46y.o..  Arabic/English telephonic medical interpreter provided interpretation for today's visit.    In summary:  Discussed AMA and associated risk for fetal aneuploidy  Discussed options for screening  Quad screen- declined  NIPS- declined  Ultrasound- performed today; report under separate cover  Discussed diagnostic testing options  Amniocentesis- declined  Reviewed family history concerns: consanguinity  Declined carrier screening  They were counseled regarding maternal age and the association with risk for chromosome conditions due to nondisjunction with aging of the ova.   We reviewed chromosomes, nondisjunction, and the associated 1 in 149risk for fetal aneuploidy related to a maternal age of 46y.o. at 191w0destation.  They were counseled that the risk for aneuploidy decreases as gestational age increases, accounting for those pregnancies which spontaneously abort.  We specifically discussed Down syndrome (trisomy 2128 trisomies 1351nd 1811and sex chromosome aneuploidies (47,XXX and 47,XXY) including the common features and prognoses of each.   We reviewed available screening options including Quad screen, noninvasive prenatal screening (NIPS)/cell free DNA (cfDNA) screening, and detailed ultrasound.  They were counseled that screening tests are used to modify a patient's a priori risk for aneuploidy, typically based on age. This estimate provides a pregnancy specific risk assessment. We reviewed the benefits and limitations of each option. Specifically, we discussed the conditions for which each test screens, the  detection rates, and false positive rates of each. They were also counseled regarding diagnostic testing via amniocentesis. We reviewed the approximate 1 in 30956-387isk for complications from amniocentesis, including spontaneous pregnancy loss. We discussed the possible results that the tests might provide including: positive, negative, unanticipated, and no result. Finally, they were counseled regarding the cost of each option and potential out of pocket expenses. After consideration of all the options, they elected ultrasound only and declined all additional screening/testing for fetal aneuploidy (Quad screen, NIPS, and amniocentesis).    A complete ultrasound was performed today. The ultrasound report will be sent under separate cover. There were no visualized fetal anomalies or markers suggestive of aneuploidy. Diagnostic testing was declined today.  They understand that screening tests cannot rule out all birth defects or genetic syndromes. The patient was advised of this limitation and states she still does not want additional testing at this time.   Both family histories were reviewed and found to be noncontributory for birth defects, intellectual disability, and known genetic conditions. The family histories were contributory for consanguinity; the patient and her partner are maternal first cousins. They reported that their mothers are sisters. We discussed that children born to a consanguineous couple are at increased risk for genetic conditions.  This increase in risk is related to the possibility of both parents passing on a recessive gene. We explained that every person carries approximately 7-10 non-working genes that when received in a double dose results in a recessive genetic condition.  In general, unrelated couples have a relatively low risk of having a child with a recessive condition because the likelihood of both parents carrying the same non-working recessive gene is very low.  However,  when a couple is related, they have inherited some of their genetic information from  the same family member, which leads to an increased chance that they may carry the same recessive gene and have a child with a recessive condition.  For first cousin unions, the risk to have a child with a birth defect, intellectual disability, or genetic condition is increased approximately 2-4% above the general population risk (3-5%).  We reviewed chromosomes, genes, and autosomal recessive inheritance.   There are a myriad of genetic disorders that occur more frequently in specific ethnic groups, those which can be traced to particular geographic locations. We discussed that although these genetic disorders are much more prevalent in specific ethnic groups, as families are becoming increasingly multiracial and multicultural, these conditions can occur in anyone from any race or ethnicity. For this reason, we discussed the availability of ethnic specific genetic carrier screening, professional society (ACOG) recommended carrier testing, and pan-ethnic carrier screening. ACOG currently recommends that all patients be offered carrier screening for cystic fibrosis, spinal muscular atrophy and hemoglobinopathies. In addition, they were counseled that there are a variety of genetic screening laboratories that have pan-ethnic, or expanded, carrier screening panels, which evaluate carrier status for a wide range of genetic conditions. Some of these conditions are severe and actionable, but also rare; others occur more commonly, but are less severe. We discussed that testing options range from screening for a single condition to panels of more than 200 autosomal or X-linked genetic conditions. We reviewed that the prevalence of each condition varies (and often varies with ethnicity). Thus the couples' background risk to be a carrier for each of these various conditions would range, and in some cases be very low or unknown. In the event  that one partner is found to be a carrier for one or more conditions, carrier screening would be available to the partner for those conditions. We reviewed that the reported history of three previous healthy children together would reduce but not eliminate the odds of them being a carrier couple for the same autosomal recessive genetic condition. After careful consideration, Ms. Mcauliffe declined expanded carrier screening today. Without further information regarding the provided family history, an accurate genetic risk cannot be calculated. Further genetic counseling is warranted if more information is obtained.  Mrs. Jendaya Boudreau's medical and pregnancy history were not reviewed today given that she was not originally scheduled for genetic counseling. If additional consultation is required, please refer to our office.    I counseled this couple regarding the above risks and available options.  The approximate face-to-face time with the genetic counselor was 45 minutes.  Chipper Oman, MS,  Certified [employer] 05/11/2017

## 2017-06-07 ENCOUNTER — Other Ambulatory Visit (HOSPITAL_COMMUNITY)
Admission: RE | Admit: 2017-06-07 | Discharge: 2017-06-07 | Disposition: A | Discharge status: Home / Self Care | Payer: Medicaid Other | Source: Ambulatory Visit | Attending: Certified Nurse Midwife | Admitting: Certified Nurse Midwife

## 2017-06-07 ENCOUNTER — Ambulatory Visit (INDEPENDENT_AMBULATORY_CARE_PROVIDER_SITE_OTHER): Payer: Medicaid Other | Admitting: Certified Nurse Midwife

## 2017-06-07 VITALS — BP 98/64 | HR 89 | Wt 148.9 lb

## 2017-06-07 DIAGNOSIS — A749 Chlamydial infection, unspecified: Secondary | ICD-10-CM | POA: Insufficient documentation

## 2017-06-07 DIAGNOSIS — O98819 Other maternal infectious and parasitic diseases complicating pregnancy, unspecified trimester: Principal | ICD-10-CM

## 2017-06-07 DIAGNOSIS — O09522 Supervision of elderly multigravida, second trimester: Secondary | ICD-10-CM

## 2017-06-07 DIAGNOSIS — O98812 Other maternal infectious and parasitic diseases complicating pregnancy, second trimester: Secondary | ICD-10-CM

## 2017-06-07 DIAGNOSIS — O0990 Supervision of high risk pregnancy, unspecified, unspecified trimester: Secondary | ICD-10-CM

## 2017-06-07 DIAGNOSIS — O09529 Supervision of elderly multigravida, unspecified trimester: Secondary | ICD-10-CM

## 2017-06-07 DIAGNOSIS — O0992 Supervision of high risk pregnancy, unspecified, second trimester: Secondary | ICD-10-CM

## 2017-06-07 NOTE — Progress Notes (Addendum)
Subjective:  Danielle Bridges is a 46 y.o. [id number] at 854w6d being seen today for ongoing prenatal care.  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  Patient reports no complaints.  Contractions: Irregular. Vag. Bleeding: None.  Movement: Present. Denies leaking of fluid.   The following portions of the patient's history were reviewed and updated as appropriate: allergies, current medications, past family history, past medical history, past social history, past surgical history and problem list. Problem list updated.  Objective:   Vitals:   06/07/17 1512  BP: 98/64  Pulse: 89  Weight: 148 lb 14.4 oz (67.5 kg)    Fetal Status: Fetal Heart Rate (bpm): 140 Fundal Height: 22 cm Movement: Present     General:  Alert, oriented and cooperative. Patient is in no acute distress.  Skin: Skin is warm and dry. No rash noted.   Cardiovascular: Normal heart rate noted  Respiratory: Normal respiratory effort, no problems with respiration noted  Abdomen: Soft, gravid, appropriate for gestational age. Pain/Pressure: Absent     Pelvic: Vag. Bleeding: None Vag D/C Character: White   Cervical exam deferred        Extremities: Normal range of motion.  Edema: None  Mental Status: Normal mood and affect. Normal behavior. Normal judgment and thought content.   Urinalysis:      Assessment and Plan:  Pregnancy: G5P3013 at 724w6d  1. Antepartum multigravida of advanced maternal age - US MFM OB FOLLOW UP; Future  2. Chlamydia infection affecting pregnancy, antepartum - TOC today  3. Supervision of high risk pregnancy, antepartum   Preterm labor symptoms  and general obstetric precautions including but not limited to vaginal bleeding, contractions, leaking of fluid and fetal movement were reviewed in detail with the patient. Please refer to After Visit Summary for other counseling recommendations.  Return in about 5 weeks (around 07/12/2017).  Video interpreter present for visit Donette LarryBhambri, Taksh Hjort, [place]

## 2017-06-08 LAB — CERVICOVAGINAL ANCILLARY ONLY
CHLAMYDIA, DNA PROBE: NEGATIVE
NEISSERIA GONORRHEA: NEGATIVE

## 2017-06-14 ENCOUNTER — Other Ambulatory Visit: Payer: Self-pay | Admitting: Certified Nurse Midwife

## 2017-06-14 ENCOUNTER — Ambulatory Visit (HOSPITAL_COMMUNITY)
Admission: RE | Admit: 2017-06-14 | Discharge: 2017-06-14 | Disposition: A | Discharge status: Home / Self Care | Payer: Medicaid Other | Source: Ambulatory Visit | Attending: Certified Nurse Midwife | Admitting: Certified Nurse Midwife

## 2017-06-14 DIAGNOSIS — Z362 Encounter for other antenatal screening follow-up: Secondary | ICD-10-CM

## 2017-06-14 DIAGNOSIS — Z3A23 23 weeks gestation of pregnancy: Secondary | ICD-10-CM

## 2017-06-14 DIAGNOSIS — O09522 Supervision of elderly multigravida, second trimester: Secondary | ICD-10-CM | POA: Diagnosis not present

## 2017-06-14 DIAGNOSIS — O09529 Supervision of elderly multigravida, unspecified trimester: Secondary | ICD-10-CM

## 2017-07-12 ENCOUNTER — Encounter: Payer: Self-pay | Admitting: General Practice

## 2017-07-12 ENCOUNTER — Ambulatory Visit (INDEPENDENT_AMBULATORY_CARE_PROVIDER_SITE_OTHER): Payer: Medicaid Other | Admitting: Medical

## 2017-07-12 VITALS — BP 104/59 | HR 86 | Wt 151.3 lb

## 2017-07-12 DIAGNOSIS — O0993 Supervision of high risk pregnancy, unspecified, third trimester: Secondary | ICD-10-CM | POA: Diagnosis present

## 2017-07-12 DIAGNOSIS — O98819 Other maternal infectious and parasitic diseases complicating pregnancy, unspecified trimester: Secondary | ICD-10-CM

## 2017-07-12 DIAGNOSIS — Z23 Encounter for immunization: Secondary | ICD-10-CM

## 2017-07-12 DIAGNOSIS — A749 Chlamydial infection, unspecified: Secondary | ICD-10-CM

## 2017-07-12 DIAGNOSIS — O0990 Supervision of high risk pregnancy, unspecified, unspecified trimester: Secondary | ICD-10-CM

## 2017-07-12 DIAGNOSIS — O98813 Other maternal infectious and parasitic diseases complicating pregnancy, third trimester: Secondary | ICD-10-CM | POA: Diagnosis not present

## 2017-07-12 NOTE — Progress Notes (Signed)
   PRENATAL VISIT NOTE  Subjective:  Danielle Bridges is a 46 y.o. Y5X8335 at [redacted]w[redacted]d being seen today for ongoing prenatal care.  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  Patient reports no complaints.  Contractions: Irritability. Vag. Bleeding: None.  Movement: Present. Denies leaking of fluid.   The following portions of the patient's history were reviewed and updated as appropriate: allergies, current medications, past family history, past medical history, past social history, past surgical history and problem list. Problem list updated.  Objective:   Vitals:   07/12/17 0844  BP: (!) 104/59  Pulse: 86  Weight: 151 lb 4.8 oz (68.6 kg)    Fetal Status: Fetal Heart Rate (bpm): 150 Fundal Height: 27 cm Movement: Present     General:  Alert, oriented and cooperative. Patient is in no acute distress.  Skin: Skin is warm and dry. No rash noted.   Cardiovascular: Normal heart rate noted  Respiratory: Normal respiratory effort, no problems with respiration noted  Abdomen: Soft, gravid, appropriate for gestational age.  Pain/Pressure: Absent     Pelvic: Cervical exam deferred        Extremities: Normal range of motion.  Edema: Trace  Mental Status:  Normal mood and affect. Normal behavior. Normal judgment and thought content.   Assessment and Plan:  Pregnancy: O2P1898 at [redacted]w[redacted]d  1. Supervision of high risk pregnancy, antepartum, third trimester - RPR - CBC - HIV antibody - Glucose Tolerance, 2 Hours w/1 Hour  2. Chlamydia infection affecting pregnancy, antepartum - TOC negative 06/07/17  Preterm labor symptoms and general obstetric  precautions including but not limited to vaginal bleeding, contractions, leaking of fluid and fetal movement were reviewed in detail with the patient. Please refer to After Visit Summary for other counseling recommendations.  Return in about 2 weeks (around 07/26/2017) for LOB.   Vonzella Nipple, PA-C

## 2017-07-12 NOTE — Patient Instructions (Signed)
Fetal Movement Counts °Patient Name: ________________________________________________ Patient Due Date: ____________________ °What is a fetal movement count? °A fetal movement count is the number of times that you feel your baby move during a certain amount of time. This may also be called a fetal kick count. A fetal movement count is recommended for every pregnant woman. You may be asked to start counting fetal movements as early as week 28 of your pregnancy. °Pay attention to when your baby is most active. You may notice your baby's sleep and wake cycles. You may also notice things that make your baby move more. You should do a fetal movement count: °· When your baby is normally most active. °· At the same time each day. ° °A good time to count movements is while you are resting, after having something to eat and drink. °How do I count fetal movements? °1. Find a quiet, comfortable area. Sit, or lie down on your side. °2. Write down the date, the start time and stop time, and the number of movements that you felt between those two times. Take this information with you to your health care visits. °3. For 2 hours, count kicks, flutters, swishes, rolls, and jabs. You should feel at least 10 movements during 2 hours. °4. You may stop counting after you have felt 10 movements. °5. If you do not feel 10 movements in 2 hours, have something to eat and drink. Then, keep resting and counting for 1 hour. If you feel at least 4 movements during that hour, you may stop counting. °Contact a health care provider if: °· You feel fewer than 4 movements in 2 hours. °· Your baby is not moving like he or she usually does. °Date: ____________ Start time: ____________ Stop time: ____________ Movements: ____________ °Date: ____________ Start time: ____________ Stop time: ____________ Movements: ____________ °Date: ____________ Start time: ____________ Stop time: ____________ Movements: ____________ °Date: ____________ Start time:  ____________ Stop time: ____________ Movements: ____________ °Date: ____________ Start time: ____________ Stop time: ____________ Movements: ____________ °Date: ____________ Start time: ____________ Stop time: ____________ Movements: ____________ °Date: ____________ Start time: ____________ Stop time: ____________ Movements: ____________ °Date: ____________ Start time: ____________ Stop time: ____________ Movements: ____________ °Date: ____________ Start time: ____________ Stop time: ____________ Movements: ____________ °This information is not intended to replace advice given to you by your health care provider. Make sure you discuss any questions you have with your health care provider. °Document Released: 12/07/2006 Document Revised: 07/06/2016 Document Reviewed: 12/17/2015 °Elsevier Interactive Patient Education © 2018 [employer] °Braxton Hicks Contractions °Contractions of the uterus can occur throughout pregnancy, but they are not always a sign that you are in labor. You may have practice contractions called Braxton Hicks contractions. These false labor contractions are sometimes confused with true labor. °What are Braxton Hicks contractions? °Braxton Hicks contractions are tightening movements that occur in the muscles of the uterus before labor. Unlike true labor contractions, these contractions do not result in opening (dilation) and thinning of the cervix. Toward the end of pregnancy (32-34 weeks), Braxton Hicks contractions can happen more often and may become stronger. These contractions are sometimes difficult to tell apart from true labor because they can be very uncomfortable. You should not feel embarrassed if you go to the hospital with false labor. °Sometimes, the only way to tell if you are in true labor is for your health care provider to look for changes in the cervix. The health care provider will do a physical exam and may monitor your contractions. If   you are not in true labor, the exam  should show that your cervix is not dilating and your water has not broken. °If there are no prenatal problems or other health problems associated with your pregnancy, it is completely safe for you to be sent home with false labor. You may continue to have Braxton Hicks contractions until you go into true labor. °How can I tell the difference between true labor and false labor? °· Differences °? False labor °? Contractions last 30-70 seconds.: Contractions are usually shorter and not as strong as true labor contractions. °? Contractions become very regular.: Contractions are usually irregular. °? Discomfort is usually felt in the top of the uterus, and it spreads to the lower abdomen and low back.: Contractions are often felt in the front of the lower abdomen and in the groin. °? Contractions do not go away with walking.: Contractions may go away when you walk around or change positions while lying down. °? Contractions usually become more intense and increase in frequency.: Contractions get weaker and are shorter-lasting as time goes on. °? The cervix dilates and gets thinner.: The cervix usually does not dilate or become thin. °Follow these instructions at home: °· Take over-the-counter and prescription medicines only as told by your health care provider. °· Keep up with your usual exercises and follow other instructions from your health care provider. °· Eat and drink lightly if you think you are going into labor. °· If Braxton Hicks contractions are making you uncomfortable: °? Change your position from lying down or resting to walking, or change from walking to resting. °? Sit and rest in a tub of warm water. °? Drink enough fluid to keep your urine clear or pale yellow. Dehydration may cause these contractions. °? Do slow and deep breathing several times an hour. °· Keep all follow-up prenatal visits as told by your health care provider. This is important. °Contact a health care provider if: °· You have a  fever. °· You have continuous pain in your abdomen. °Get help right away if: °· Your contractions become stronger, more regular, and closer together. °· You have fluid leaking or gushing from your vagina. °· You pass blood-tinged mucus (bloody show). °· You have bleeding from your vagina. °· You have low back pain that you never had before. °· You feel your baby’s head pushing down and causing pelvic pressure. °· Your baby is not moving inside you as much as it used to. °Summary °· Contractions that occur before labor are called Braxton Hicks contractions, false labor, or practice contractions. °· Braxton Hicks contractions are usually shorter, weaker, farther apart, and less regular than true labor contractions. True labor contractions usually become progressively stronger and regular and they become more frequent. °· Manage discomfort from Braxton Hicks contractions by changing position, resting in a warm bath, drinking plenty of water, or practicing deep breathing. °This information is not intended to replace advice given to you by your health care provider. Make sure you discuss any questions you have with your health care provider. °Document Released: 11/07/2005 Document Revised: 09/26/2016 Document Reviewed: 09/26/2016 °Elsevier Interactive Patient Education © 2017 [employer] ° °

## 2017-07-12 NOTE — Progress Notes (Signed)
[employer] interpreter Saif [phone]

## 2017-07-13 LAB — CBC
HEMOGLOBIN: 12.1 g/dL (ref 11.1–15.9)
Hematocrit: 37.5 % (ref 34.0–46.6)
MCH: 29.4 pg (ref 26.6–33.0)
MCHC: 32.3 g/dL (ref 31.5–35.7)
MCV: 91 fL (ref 79–97)
Platelets: 239 10*3/uL (ref 150–379)
RBC: 4.12 x10E6/uL (ref 3.77–5.28)
RDW: 13.7 % (ref 12.3–15.4)
WBC: 7.2 10*3/uL (ref 3.4–10.8)

## 2017-07-13 LAB — GLUCOSE TOLERANCE, 2 HOURS W/ 1HR
GLUCOSE, 1 HOUR: 135 mg/dL (ref 65–179)
GLUCOSE, 2 HOUR: 129 mg/dL (ref 65–152)
Glucose, Fasting: 83 mg/dL (ref 65–91)

## 2017-07-13 LAB — HIV ANTIBODY (ROUTINE TESTING W REFLEX): HIV SCREEN 4TH GENERATION: NONREACTIVE

## 2017-07-13 LAB — RPR: RPR Ser Ql: NONREACTIVE

## 2017-07-26 ENCOUNTER — Ambulatory Visit (INDEPENDENT_AMBULATORY_CARE_PROVIDER_SITE_OTHER): Payer: Medicaid Other | Admitting: Advanced Practice Midwife

## 2017-07-26 ENCOUNTER — Encounter: Payer: Self-pay | Admitting: Advanced Practice Midwife

## 2017-07-26 VITALS — BP 108/57 | HR 88 | Wt 149.1 lb

## 2017-07-26 DIAGNOSIS — O0993 Supervision of high risk pregnancy, unspecified, third trimester: Secondary | ICD-10-CM

## 2017-07-26 DIAGNOSIS — J069 Acute upper respiratory infection, unspecified: Secondary | ICD-10-CM

## 2017-07-26 NOTE — Patient Instructions (Addendum)
Safe over the counter medications for cold/cough in pregnancy: Robitussin (dextromethorphan) and Robitussin DM cough syrups  Vicks plain cough syrup  Vicks or other menthol rub on your chest, temples, and under the nose  Nasal strips (sticky pads that open congested airways)  Hall's cough drops or Cepacol lozenges  Tylenol (acetaminophen) for aches, pains, and fevers  Cough suppressant at night  Expectorant (Mucinex or guaifenesin) during the day  Mylanta, Tums, or similar medications for heartburn, nausea, or upset stomach  And Sudafed (pseuphedrine) after 12 weeks of pregnancy   Third Trimester of Pregnancy The third trimester is from week 28 through week 40 (months 7 through 9). The third trimester is a time when the unborn baby (fetus) is growing rapidly. At the end of the ninth month, the fetus is about 20 inches in length and weighs 6-10 pounds. Body changes during your third trimester Your body will continue to go through many changes during pregnancy. The changes vary from woman to woman. During the third trimester:  Your weight will continue to increase. You can expect to gain 25-35 pounds (11-16 kg) by the end of the pregnancy.  You may begin to get stretch marks on your hips, abdomen, and breasts.  You may urinate more often because the fetus is moving lower into your pelvis and pressing on your bladder.  You may develop or continue to have heartburn. This is caused by increased hormones that slow down muscles in the digestive tract.  You may develop or continue to have constipation because increased hormones slow digestion and cause the muscles that push waste through your intestines to relax.  You may develop hemorrhoids. These are swollen veins (varicose veins) in the rectum that can itch or be painful.  You may develop swollen, bulging veins (varicose veins) in your legs.  You may have increased body aches in the pelvis, back, or thighs. This is due to weight gain  and increased hormones that are relaxing your joints.  You may have changes in your hair. These can include thickening of your hair, rapid growth, and changes in texture. Some women also have hair loss during or after pregnancy, or hair that feels dry or thin. Your hair will most likely return to normal after your baby is born.  Your breasts will continue to grow and they will continue to become tender. A yellow fluid (colostrum) may leak from your breasts. This is the first milk you are producing for your baby.  Your belly button may stick out.  You may notice more swelling in your hands, face, or ankles.  You may have increased tingling or numbness in your hands, arms, and legs. The skin on your belly may also feel numb.  You may feel short of breath because of your expanding uterus.  You may have more problems sleeping. This can be caused by the size of your belly, increased need to urinate, and an increase in your body's metabolism.  You may notice the fetus "dropping," or moving lower in your abdomen (lightening).  You may have increased vaginal discharge.  You may notice your joints feel loose and you may have pain around your pelvic bone.  What to expect at prenatal visits You will have prenatal exams every 2 weeks until week 36. Then you will have weekly prenatal exams. During a routine prenatal visit:  You will be weighed to make sure you and the baby are growing normally.  Your blood pressure will be taken.  Your abdomen will  be measured to track your baby's growth.  The fetal heartbeat will be listened to.  Any test results from the previous visit will be discussed.  You may have a cervical check near your due date to see if your cervix has softened or thinned (effaced).  You will be tested for Group B streptococcus. This happens between 35 and 37 weeks.  Your health care provider may ask you:  What your birth plan is.  How you are feeling.  If you are feeling  the baby move.  If you have had any abnormal symptoms, such as leaking fluid, bleeding, severe headaches, or abdominal cramping.  If you are using any tobacco products, including cigarettes, chewing tobacco, and electronic cigarettes.  If you have any questions.  Other tests or screenings that may be performed during your third trimester include:  Blood tests that check for low iron levels (anemia).  Fetal testing to check the health, activity level, and growth of the fetus. Testing is done if you have certain medical conditions or if there are problems during the pregnancy.  Nonstress test (NST). This test checks the health of your baby to make sure there are no signs of problems, such as the baby not getting enough oxygen. During this test, a belt is placed around your belly. The baby is made to move, and its heart rate is monitored during movement.  What is false labor? False labor is a condition in which you feel small, irregular tightenings of the muscles in the womb (contractions) that usually go away with rest, changing position, or drinking water. These are called Braxton Hicks contractions. Contractions may last for hours, days, or even weeks before true labor sets in. If contractions come at regular intervals, become more frequent, increase in intensity, or become painful, you should see your health care provider. What are the signs of labor?  Abdominal cramps.  Regular contractions that start at 10 minutes apart and become stronger and more frequent with time.  Contractions that start on the top of the uterus and spread down to the lower abdomen and back.  Increased pelvic pressure and dull back pain.  A watery or bloody mucus discharge that comes from the vagina.  Leaking of amniotic fluid. This is also known as your "water breaking." It could be a slow trickle or a gush. Let your health care provider know if it has a color or strange odor. If you have any of these signs,  call your health care provider right away, even if it is before your due date. Follow these instructions at home: Medicines  Follow your health care provider's instructions regarding medicine use. Specific medicines may be either safe or unsafe to take during pregnancy.  Take a prenatal vitamin that contains at least 600 micrograms (mcg) of folic acid.  If you develop constipation, try taking a stool softener if your health care provider approves. Eating and drinking  Eat a balanced diet that includes fresh fruits and vegetables, whole grains, good sources of protein such as meat, eggs, or tofu, and low-fat dairy. Your health care provider will help you determine the amount of weight gain that is right for you.  Avoid raw meat and uncooked cheese. These carry germs that can cause birth defects in the baby.  If you have low calcium intake from food, talk to your health care provider about whether you should take a daily calcium supplement.  Eat four or five small meals rather than three large meals a  day.  Limit foods that are high in fat and processed sugars, such as fried and sweet foods.  To prevent constipation: ? Drink enough fluid to keep your urine clear or pale yellow. ? Eat foods that are high in fiber, such as fresh fruits and vegetables, whole grains, and beans. Activity  Exercise only as directed by your health care provider. Most women can continue their usual exercise routine during pregnancy. Try to exercise for 30 minutes at least 5 days a week. Stop exercising if you experience uterine contractions.  Avoid heavy lifting.  Do not exercise in extreme heat or humidity, or at high altitudes.  Wear low-heel, comfortable shoes.  Practice good posture.  You may continue to have sex unless your health care provider tells you otherwise. Relieving pain and discomfort  Take frequent breaks and rest with your legs elevated if you have leg cramps or low back pain.  Take  warm sitz baths to soothe any pain or discomfort caused by hemorrhoids. Use hemorrhoid cream if your health care provider approves.  Wear a good support bra to prevent discomfort from breast tenderness.  If you develop varicose veins: ? Wear support pantyhose or compression stockings as told by your healthcare provider. ? Elevate your feet for 15 minutes, 3-4 times a day. Prenatal care  Write down your questions. Take them to your prenatal visits.  Keep all your prenatal visits as told by your health care provider. This is important. Safety  Wear your seat belt at all times when driving.  Make a list of emergency phone numbers, including numbers for family, friends, the hospital, and police and fire departments. General instructions  Avoid cat litter boxes and soil used by cats. These carry germs that can cause birth defects in the baby. If you have a cat, ask someone to clean the litter box for you.  Do not travel far distances unless it is absolutely necessary and only with the approval of your health care provider.  Do not use hot tubs, steam rooms, or saunas.  Do not drink alcohol.  Do not use any products that contain nicotine or tobacco, such as cigarettes and e-cigarettes. If you need help quitting, ask your health care provider.  Do not use any medicinal herbs or unprescribed drugs. These chemicals affect the formation and growth of the baby.  Do not douche or use tampons or scented sanitary pads.  Do not cross your legs for long periods of time.  To prepare for the arrival of your baby: ? Take prenatal classes to understand, practice, and ask questions about labor and delivery. ? Make a trial run to the hospital. ? Visit the hospital and tour the maternity area. ? Arrange for maternity or paternity leave through employers. ? Arrange for family and friends to take care of pets while you are in the hospital. ? Purchase a rear-facing car seat and make sure you know how  to install it in your car. ? Pack your hospital bag. ? Prepare the baby's nursery. Make sure to remove all pillows and stuffed animals from the baby's crib to prevent suffocation.  Visit your dentist if you have not gone during your pregnancy. Use a soft toothbrush to brush your teeth and be gentle when you floss. Contact a health care provider if:  You are unsure if you are in labor or if your water has broken.  You become dizzy.  You have mild pelvic cramps, pelvic pressure, or nagging pain in your abdominal area.  You have lower back pain.  You have persistent nausea, vomiting, or diarrhea.  You have an unusual or bad smelling vaginal discharge.  You have pain when you urinate. Get help right away if:  Your water breaks before 37 weeks.  You have regular contractions less than 5 minutes apart before 37 weeks.  You have a fever.  You are leaking fluid from your vagina.  You have spotting or bleeding from your vagina.  You have severe abdominal pain or cramping.  You have rapid weight loss or weight gain.  You have shortness of breath with chest pain.  You notice sudden or extreme swelling of your face, hands, ankles, feet, or legs.  Your baby makes fewer than 10 movements in 2 hours.  You have severe headaches that do not go away when you take medicine.  You have vision changes. Summary  The third trimester is from week 28 through week 40, months 7 through 9. The third trimester is a time when the unborn baby (fetus) is growing rapidly.  During the third trimester, your discomfort may increase as you and your baby continue to gain weight. You may have abdominal, leg, and back pain, sleeping problems, and an increased need to urinate.  During the third trimester your breasts will keep growing and they will continue to become tender. A yellow fluid (colostrum) may leak from your breasts. This is the first milk you are producing for your baby.  False labor is a  condition in which you feel small, irregular tightenings of the muscles in the womb (contractions) that eventually go away. These are called Braxton Hicks contractions. Contractions may last for hours, days, or even weeks before true labor sets in.  Signs of labor can include: abdominal cramps; regular contractions that start at 10 minutes apart and become stronger and more frequent with time; watery or bloody mucus discharge that comes from the vagina; increased pelvic pressure and dull back pain; and leaking of amniotic fluid. This information is not intended to replace advice given to you by your health care provider. Make sure you discuss any questions you have with your health care provider. Document Released: 11/01/2001 Document Revised: 04/14/2016 Document Reviewed: 01/08/2013 Elsevier Interactive Patient Education  2017 [employer].

## 2017-07-26 NOTE — Progress Notes (Signed)
   PRENATAL VISIT NOTE  Subjective:  Danielle Bridges is a 46 y.o. [id number] at 5443w6d being seen today for ongoing prenatal care.  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  Patient reports runny nose, congestion, h/a x 2 days.  Contractions: Not present. Vag. Bleeding: None.  Movement: Present. Denies leaking of fluid.   The following portions of the patient's history were reviewed and updated as appropriate: allergies, current medications, past family history, past medical history, past social history, past surgical history and problem list. Problem list updated.  Objective:   Vitals:   07/26/17 1056 07/26/17 1058  BP: (!) 92/51 (!) 108/57  Pulse: 91 88  Weight: 149 lb 1.6 oz (67.6 kg)     Fetal Status: Fetal Heart Rate (bpm): 145 Fundal Height: 30 cm Movement: Present     General:  Alert, oriented and cooperative. Patient is in no acute distress.  Skin: Skin is warm and dry. No rash noted.   Cardiovascular: Normal heart rate noted  Respiratory: Normal respiratory effort, no problems with respiration noted  Abdomen: Soft, gravid, appropriate for gestational age.  Pain/Pressure: Absent     Pelvic: Cervical exam deferred        Extremities: Normal range of motion.  Edema: Trace  Mental Status:  Normal mood and affect. Normal behavior. Normal judgment and thought content.   Assessment and Plan:  Pregnancy: [id number] at 4443w6d  1. Supervision of high risk pregnancy, antepartum, third trimester   2. Viral upper respiratory tract infection --Rest, increase PO fluids, list of safe cold meds in pregnancy given.  Preterm labor symptoms and  general obstetric precautions including but not limited to vaginal bleeding, contractions, leaking of fluid and fetal movement were reviewed in detail with the patient. Please refer to After Visit Summary for other counseling recommendations.  Return in about 2 weeks (around 08/09/2017).   Sharen CounterLisa Leftwich-Kirby, CNM

## 2017-07-26 NOTE — Progress Notes (Signed)
Patient is here for routine OB visit 6753w6d. Patient wants to know what she can take for congestion and runny nose.

## 2017-08-09 ENCOUNTER — Ambulatory Visit (INDEPENDENT_AMBULATORY_CARE_PROVIDER_SITE_OTHER): Payer: Medicaid Other | Admitting: Obstetrics and Gynecology

## 2017-08-09 ENCOUNTER — Encounter: Payer: Self-pay | Admitting: Obstetrics and Gynecology

## 2017-08-09 VITALS — BP 107/58 | HR 87 | Wt 148.8 lb

## 2017-08-09 DIAGNOSIS — O0990 Supervision of high risk pregnancy, unspecified, unspecified trimester: Secondary | ICD-10-CM

## 2017-08-09 DIAGNOSIS — Z23 Encounter for immunization: Secondary | ICD-10-CM

## 2017-08-09 DIAGNOSIS — N90813 Female genital mutilation Type III status: Secondary | ICD-10-CM

## 2017-08-09 DIAGNOSIS — Z603 Acculturation difficulty: Secondary | ICD-10-CM

## 2017-08-09 DIAGNOSIS — O0993 Supervision of high risk pregnancy, unspecified, third trimester: Secondary | ICD-10-CM

## 2017-08-09 DIAGNOSIS — A749 Chlamydial infection, unspecified: Secondary | ICD-10-CM

## 2017-08-09 DIAGNOSIS — O98813 Other maternal infectious and parasitic diseases complicating pregnancy, third trimester: Secondary | ICD-10-CM | POA: Diagnosis not present

## 2017-08-09 DIAGNOSIS — O98819 Other maternal infectious and parasitic diseases complicating pregnancy, unspecified trimester: Secondary | ICD-10-CM

## 2017-08-09 DIAGNOSIS — O09523 Supervision of elderly multigravida, third trimester: Secondary | ICD-10-CM

## 2017-08-09 NOTE — Progress Notes (Signed)
Prenatal Visit Note Date: 08/09/2017 Clinic: [facility]  Subjective:  Danielle Bridges is a 46 y.o. Z6X0960 at [redacted]w[redacted]d being seen today for ongoing prenatal care.  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  Patient reports no complaints.   Contractions: Not present. Vag. Bleeding: None.  Movement: Present. Denies leaking of fluid.   The following portions of the patient's history were reviewed and updated as appropriate: allergies, current medications, past family history, past medical history, past social history, past surgical history and problem list. Problem list updated.  Objective:   Vitals:   08/09/17 1456  BP: (!) 107/58  Pulse: 87  Weight: 67.5 kg (148 lb 12.8 oz)    Fetal Status: Fetal Heart Rate (bpm): 143 Fundal Height: 31 cm Movement: Present     General:  Alert, oriented and cooperative. Patient is in no acute distress.  Skin: Skin is warm and dry. No rash noted.   Cardiovascular: Normal heart rate noted  Respiratory: Normal respiratory effort, no problems with respiration noted  Abdomen: Soft, gravid, appropriate for gestational age. Pain/Pressure: Absent     Pelvic:  Cervical exam deferred        Extremities: Normal range of motion.  Edema: None  Mental Status: Normal mood and affect. Normal behavior. Normal judgment and thought content.   Urinalysis:      Assessment and Plan:  Pregnancy: A5W0981 at [redacted]w[redacted]d  1. Supervision of high risk pregnancy, antepartum Routine care. OCPs - Flu Vaccine QUAD 36+ mos IM (Fluarix, Quad PF)  2. Chlamydia infection affecting pregnancy, antepartum toc neg. Rpt at 35-37wks - Flu Vaccine QUAD  36+ mos IM (Fluarix, Quad PF)  3. Elderly multigravida in third trimester No issues.   4. 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 Language barrier, cultural differences Interpreter used  Preterm labor symptoms and general obstetric precautions including but not limited to vaginal bleeding, contractions, leaking of fluid and fetal movement were reviewed in detail with the patient. Please refer to After Visit Summary for other counseling recommendations.  Return in about 2 weeks (around 08/23/2017) for rob.   Petroleum Bing, MD

## 2017-08-09 NOTE — Patient Instructions (Signed)
AREA PEDIATRIC/FAMILY PRACTICE PHYSICIANS   [facility] [address], [address] [place], [place]  [address] Phone - [phone]   Fax - [phone]  [facility] [address] [place], [place] [address] Phone - [phone]   Fax - [phone]  [facility] [address] [place], [place]  [address] Phone - [phone]   Fax - [phone]  [facility] [address] [place], [place]  [address] Phone - [phone]   Fax - [phone]   [facility] [address] [place], [place]  [address] Phone - [phone]   Fax - [phone]  [facility] [address] [place], [place]  [address] Phone - [phone]   Fax - [phone]  [facility] [address] [place], [place]  [address] Phone - [phone]   Fax - [phone]  [facility] [address], [address] [place], [place]  [address] Phone - [phone]   Fax - [phone]  [facility] [address] [place], [place]  [address] Phone - [phone]   Fax - [phone] [facility] [address] [place], [place]  [address] Phone - [phone]   Fax - [phone]  [facility] [address] [place], [place]  [address] Phone - [phone]   Fax - [phone]  [facility] [address] [place], [place]  [address] Phone - [phone]   Fax - [phone]  [facility] [address] [place], [place]  [address] Phone - [phone]   Fax - [phone]  [facility] [address] [place], [place]  [address] Phone - [phone]  [facility] [address] [place], [place]  [address] Phone - [phone]   Fax - [phone]  [facility] [address] [place], [place]  [address] Phone - [phone]   Fax - [phone]  [facility] [address] [place], [place]  [address] Phone - [phone]   Fax - [phone]  [facility] [address] [place], [place]  [address] Phone - [phone]   Fax - [phone]   [facility] [address] [place], [place]  [address] Phone - [phone]   Fax - [phone]  [facility] [address] [place], [place]  [address] Phone - [phone]   Fax - [phone]  [facility] [address] [place], [place]  [address] Phone - [phone]   Fax - [phone]  [facility] [address] , [place]  [address] Phone - [phone]   Fax - [phone]  [facility] [address] , [place]  [address] Phone - [phone]   Fax - [phone] [facility] [address] [place]  [address] Phone - [phone]   Fax - [phone]  [facility] [address] [place], [place]  [address] Phone - [phone]   Fax - [phone]  [facility] [address] [place], [place]  [address] Phone - [phone]   Fax - [phone]  [facility] Charlene Flemming MD [address] [place]  [address] Phone [phone]  Fax [phone]   

## 2017-08-24 ENCOUNTER — Encounter: Payer: Self-pay | Admitting: Obstetrics & Gynecology

## 2017-08-24 ENCOUNTER — Ambulatory Visit (INDEPENDENT_AMBULATORY_CARE_PROVIDER_SITE_OTHER): Payer: Medicaid Other | Admitting: Obstetrics & Gynecology

## 2017-08-24 VITALS — BP 101/60 | HR 98 | Wt 151.6 lb

## 2017-08-24 DIAGNOSIS — O0993 Supervision of high risk pregnancy, unspecified, third trimester: Secondary | ICD-10-CM

## 2017-08-24 DIAGNOSIS — O0990 Supervision of high risk pregnancy, unspecified, unspecified trimester: Secondary | ICD-10-CM

## 2017-08-24 DIAGNOSIS — O09523 Supervision of elderly multigravida, third trimester: Secondary | ICD-10-CM

## 2017-08-24 NOTE — Patient Instructions (Signed)
Third Trimester of Pregnancy The third trimester is from week 28 through week 40 (months 7 through 9). The third trimester is a time when the unborn baby (fetus) is growing rapidly. At the end of the ninth month, the fetus is about 20 inches in length and weighs 6-10 pounds. Body changes during your third trimester Your body will continue to go through many changes during pregnancy. The changes vary from woman to woman. During the third trimester:  Your weight will continue to increase. You can expect to gain 25-35 pounds (11-16 kg) by the end of the pregnancy.  You may begin to get stretch marks on your hips, abdomen, and breasts.  You may urinate more often because the fetus is moving lower into your pelvis and pressing on your bladder.  You may develop or continue to have heartburn. This is caused by increased hormones that slow down muscles in the digestive tract.  You may develop or continue to have constipation because increased hormones slow digestion and cause the muscles that push waste through your intestines to relax.  You may develop hemorrhoids. These are swollen veins (varicose veins) in the rectum that can itch or be painful.  You may develop swollen, bulging veins (varicose veins) in your legs.  You may have increased body aches in the pelvis, back, or thighs. This is due to weight gain and increased hormones that are relaxing your joints.  You may have changes in your hair. These can include thickening of your hair, rapid growth, and changes in texture. Some women also have hair loss during or after pregnancy, or hair that feels dry or thin. Your hair will most likely return to normal after your baby is born.  Your breasts will continue to grow and they will continue to become tender. A yellow fluid (colostrum) may leak from your breasts. This is the first milk you are producing for your baby.  Your belly button may stick out.  You may notice more swelling in your hands,  face, or ankles.  You may have increased tingling or numbness in your hands, arms, and legs. The skin on your belly may also feel numb.  You may feel short of breath because of your expanding uterus.  You may have more problems sleeping. This can be caused by the size of your belly, increased need to urinate, and an increase in your body's metabolism.  You may notice the fetus "dropping," or moving lower in your abdomen (lightening).  You may have increased vaginal discharge.  You may notice your joints feel loose and you may have pain around your pelvic bone.  What to expect at prenatal visits You will have prenatal exams every 2 weeks until week 36. Then you will have weekly prenatal exams. During a routine prenatal visit:  You will be weighed to make sure you and the baby are growing normally.  Your blood pressure will be taken.  Your abdomen will be measured to track your baby's growth.  The fetal heartbeat will be listened to.  Any test results from the previous visit will be discussed.  You may have a cervical check near your due date to see if your cervix has softened or thinned (effaced).  You will be tested for Group B streptococcus. This happens between 35 and 37 weeks.  Your health care provider may ask you:  What your birth plan is.  How you are feeling.  If you are feeling the baby move.  If you have had   any abnormal symptoms, such as leaking fluid, bleeding, severe headaches, or abdominal cramping.  If you are using any tobacco products, including cigarettes, chewing tobacco, and electronic cigarettes.  If you have any questions.  Other tests or screenings that may be performed during your third trimester include:  Blood tests that check for low iron levels (anemia).  Fetal testing to check the health, activity level, and growth of the fetus. Testing is done if you have certain medical conditions or if there are problems during the  pregnancy.  Nonstress test (NST). This test checks the health of your baby to make sure there are no signs of problems, such as the baby not getting enough oxygen. During this test, a belt is placed around your belly. The baby is made to move, and its heart rate is monitored during movement.  What is false labor? False labor is a condition in which you feel small, irregular tightenings of the muscles in the womb (contractions) that usually go away with rest, changing position, or drinking water. These are called Braxton Hicks contractions. Contractions may last for hours, days, or even weeks before true labor sets in. If contractions come at regular intervals, become more frequent, increase in intensity, or become painful, you should see your health care provider. What are the signs of labor?  Abdominal cramps.  Regular contractions that start at 10 minutes apart and become stronger and more frequent with time.  Contractions that start on the top of the uterus and spread down to the lower abdomen and back.  Increased pelvic pressure and dull back pain.  A watery or bloody mucus discharge that comes from the vagina.  Leaking of amniotic fluid. This is also known as your "water breaking." It could be a slow trickle or a gush. Let your health care provider know if it has a color or strange odor. If you have any of these signs, call your health care provider right away, even if it is before your due date. Follow these instructions at home: Medicines  Follow your health care provider's instructions regarding medicine use. Specific medicines may be either safe or unsafe to take during pregnancy.  Take a prenatal vitamin that contains at least 600 micrograms (mcg) of folic acid.  If you develop constipation, try taking a stool softener if your health care provider approves. Eating and drinking  Eat a balanced diet that includes fresh fruits and vegetables, whole grains, good sources of protein  such as meat, eggs, or tofu, and low-fat dairy. Your health care provider will help you determine the amount of weight gain that is right for you.  Avoid raw meat and uncooked cheese. These carry germs that can cause birth defects in the baby.  If you have low calcium intake from food, talk to your health care provider about whether you should take a daily calcium supplement.  Eat four or five small meals rather than three large meals a day.  Limit foods that are high in fat and processed sugars, such as fried and sweet foods.  To prevent constipation: ? Drink enough fluid to keep your urine clear or pale yellow. ? Eat foods that are high in fiber, such as fresh fruits and vegetables, whole grains, and beans. Activity  Exercise only as directed by your health care provider. Most women can continue their usual exercise routine during pregnancy. Try to exercise for 30 minutes at least 5 days a week. Stop exercising if you experience uterine contractions.  Avoid heavy   lifting.  Do not exercise in extreme heat or humidity, or at high altitudes.  Wear low-heel, comfortable shoes.  Practice good posture.  You may continue to have sex unless your health care provider tells you otherwise. Relieving pain and discomfort  Take frequent breaks and rest with your legs elevated if you have leg cramps or low back pain.  Take warm sitz baths to soothe any pain or discomfort caused by hemorrhoids. Use hemorrhoid cream if your health care provider approves.  Wear a good support bra to prevent discomfort from breast tenderness.  If you develop varicose veins: ? Wear support pantyhose or compression stockings as told by your healthcare provider. ? Elevate your feet for 15 minutes, 3-4 times a day. Prenatal care  Write down your questions. Take them to your prenatal visits.  Keep all your prenatal visits as told by your health care provider. This is important. Safety  Wear your seat belt at  all times when driving.  Make a list of emergency phone numbers, including numbers for family, friends, the hospital, and police and fire departments. General instructions  Avoid cat litter boxes and soil used by cats. These carry germs that can cause birth defects in the baby. If you have a cat, ask someone to clean the litter box for you.  Do not travel far distances unless it is absolutely necessary and only with the approval of your health care provider.  Do not use hot tubs, steam rooms, or saunas.  Do not drink alcohol.  Do not use any products that contain nicotine or tobacco, such as cigarettes and e-cigarettes. If you need help quitting, ask your health care provider.  Do not use any medicinal herbs or unprescribed drugs. These chemicals affect the formation and growth of the baby.  Do not douche or use tampons or scented sanitary pads.  Do not cross your legs for long periods of time.  To prepare for the arrival of your baby: ? Take prenatal classes to understand, practice, and ask questions about labor and delivery. ? Make a trial run to the hospital. ? Visit the hospital and tour the maternity area. ? Arrange for maternity or paternity leave through employers. ? Arrange for family and friends to take care of pets while you are in the hospital. ? Purchase a rear-facing car seat and make sure you know how to install it in your car. ? Pack your hospital bag. ? Prepare the baby's nursery. Make sure to remove all pillows and stuffed animals from the baby's crib to prevent suffocation.  Visit your dentist if you have not gone during your pregnancy. Use a soft toothbrush to brush your teeth and be gentle when you floss. Contact a health care provider if:  You are unsure if you are in labor or if your water has broken.  You become dizzy.  You have mild pelvic cramps, pelvic pressure, or nagging pain in your abdominal area.  You have lower back pain.  You have persistent  nausea, vomiting, or diarrhea.  You have an unusual or bad smelling vaginal discharge.  You have pain when you urinate. Get help right away if:  Your water breaks before 37 weeks.  You have regular contractions less than 5 minutes apart before 37 weeks.  You have a fever.  You are leaking fluid from your vagina.  You have spotting or bleeding from your vagina.  You have severe abdominal pain or cramping.  You have rapid weight loss or weight gain.    You have shortness of breath with chest pain.  You notice sudden or extreme swelling of your face, hands, ankles, feet, or legs.  Your baby makes fewer than 10 movements in 2 hours.  You have severe headaches that do not go away when you take medicine.  You have vision changes. Summary  The third trimester is from week 28 through week 40, months 7 through 9. The third trimester is a time when the unborn baby (fetus) is growing rapidly.  During the third trimester, your discomfort may increase as you and your baby continue to gain weight. You may have abdominal, leg, and back pain, sleeping problems, and an increased need to urinate.  During the third trimester your breasts will keep growing and they will continue to become tender. A yellow fluid (colostrum) may leak from your breasts. This is the first milk you are producing for your baby.  False labor is a condition in which you feel small, irregular tightenings of the muscles in the womb (contractions) that eventually go away. These are called Braxton Hicks contractions. Contractions may last for hours, days, or even weeks before true labor sets in.  Signs of labor can include: abdominal cramps; regular contractions that start at 10 minutes apart and become stronger and more frequent with time; watery or bloody mucus discharge that comes from the vagina; increased pelvic pressure and dull back pain; and leaking of amniotic fluid. This information is not intended to replace advice  given to you by your health care provider. Make sure you discuss any questions you have with your health care provider. Document Released: 11/01/2001 Document Revised: 04/14/2016 Document Reviewed: 01/08/2013 Elsevier Interactive Patient Education  2017 [employer]  

## 2017-08-31 ENCOUNTER — Encounter (HOSPITAL_COMMUNITY): Payer: Self-pay

## 2017-08-31 ENCOUNTER — Ambulatory Visit (HOSPITAL_COMMUNITY)
Admission: RE | Admit: 2017-08-31 | Discharge: 2017-08-31 | Disposition: A | Discharge status: Home / Self Care | Payer: Medicaid Other | Source: Ambulatory Visit | Attending: Obstetrics & Gynecology | Admitting: Obstetrics & Gynecology

## 2017-08-31 DIAGNOSIS — A749 Chlamydial infection, unspecified: Secondary | ICD-10-CM

## 2017-08-31 DIAGNOSIS — O98819 Other maternal infectious and parasitic diseases complicating pregnancy, unspecified trimester: Secondary | ICD-10-CM

## 2017-08-31 DIAGNOSIS — O09523 Supervision of elderly multigravida, third trimester: Secondary | ICD-10-CM | POA: Insufficient documentation

## 2017-08-31 DIAGNOSIS — Z3A35 35 weeks gestation of pregnancy: Secondary | ICD-10-CM | POA: Insufficient documentation

## 2017-08-31 DIAGNOSIS — O0990 Supervision of high risk pregnancy, unspecified, unspecified trimester: Secondary | ICD-10-CM

## 2017-08-31 DIAGNOSIS — Z362 Encounter for other antenatal screening follow-up: Secondary | ICD-10-CM | POA: Insufficient documentation

## 2017-08-31 DIAGNOSIS — Z843 Family history of consanguinity: Secondary | ICD-10-CM

## 2017-09-07 ENCOUNTER — Ambulatory Visit: Payer: Self-pay

## 2017-09-07 ENCOUNTER — Ambulatory Visit (INDEPENDENT_AMBULATORY_CARE_PROVIDER_SITE_OTHER): Payer: Medicaid Other | Admitting: Family Medicine

## 2017-09-07 ENCOUNTER — Other Ambulatory Visit (HOSPITAL_COMMUNITY)
Admission: RE | Admit: 2017-09-07 | Discharge: 2017-09-07 | Disposition: A | Discharge status: Home / Self Care | Payer: Medicaid Other | Source: Ambulatory Visit | Attending: Family Medicine | Admitting: Family Medicine

## 2017-09-07 VITALS — BP 101/55 | HR 82 | Wt 153.0 lb

## 2017-09-07 DIAGNOSIS — O09523 Supervision of elderly multigravida, third trimester: Secondary | ICD-10-CM

## 2017-09-07 DIAGNOSIS — O0993 Supervision of high risk pregnancy, unspecified, third trimester: Secondary | ICD-10-CM | POA: Insufficient documentation

## 2017-09-07 DIAGNOSIS — N90813 Female genital mutilation Type III status: Secondary | ICD-10-CM | POA: Diagnosis not present

## 2017-09-07 DIAGNOSIS — Z3A36 36 weeks gestation of pregnancy: Secondary | ICD-10-CM | POA: Diagnosis not present

## 2017-09-07 DIAGNOSIS — O26893 Other specified pregnancy related conditions, third trimester: Secondary | ICD-10-CM | POA: Insufficient documentation

## 2017-09-07 NOTE — Progress Notes (Signed)
   PRENATAL VISIT NOTE  Subjective:  Danielle Bridges is a 46 y.o. [id number] at 3829w0d being seen today for ongoing prenatal care.  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  Patient reports no complaints.  Contractions: Not present. Vag. Bleeding: None.  Movement: Present. Denies leaking of fluid.   The following portions of the patient's history were reviewed and updated as appropriate: allergies, current medications, past family history, past medical history, past social history, past surgical history and problem list. Problem list updated.  Objective:   Vitals:   09/07/17 1429  BP: (!) 101/55  Pulse: 82  Weight: 153 lb (69.4 kg)    Fetal Status: Fetal Heart Rate (bpm): 130 Fundal Height: 36 cm Movement: Present  Presentation: Vertex  General:  Alert, oriented and cooperative. Patient is in no acute distress.  Skin: Skin is warm and dry. No rash noted.   Cardiovascular: Normal heart rate noted  Respiratory: Normal respiratory effort, no problems with respiration noted  Abdomen: Soft, gravid, appropriate for gestational age.  Pain/Pressure: Absent     Pelvic: Cervical exam performed Dilation: Closed Effacement (%): Thick Station: Ballotable  Extremities: Normal range of motion.  Edema: None  Mental Status:  Normal mood and affect. Normal behavior. Normal judgment and thought content.   Assessment and Plan:  Pregnancy: [id number] at 829w0d  1. Supervision of high risk pregnancy, antepartum, third trimester FHT and FH normal - Strep Gp B NAA - Cervicovaginal ancillary only  2. Elderly multigravida in third trimester Start antenatal testing today. BPP 8/10 Low  normal AFI at 6 - repeat next week.  3. 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 Please refer to After Visit Summary for other counseling recommendations.  No Follow-up on file.   Levie HeritageJacob J Stinson, DO

## 2017-09-08 LAB — CERVICOVAGINAL ANCILLARY ONLY
Chlamydia: NEGATIVE
NEISSERIA GONORRHEA: NEGATIVE

## 2017-09-09 LAB — STREP GP B NAA: Strep Gp B NAA: NEGATIVE

## 2017-09-11 ENCOUNTER — Encounter: Payer: Self-pay | Admitting: Obstetrics & Gynecology

## 2017-09-11 ENCOUNTER — Ambulatory Visit: Payer: Self-pay

## 2017-09-11 ENCOUNTER — Ambulatory Visit (INDEPENDENT_AMBULATORY_CARE_PROVIDER_SITE_OTHER): Payer: Medicaid Other | Admitting: Advanced Practice Midwife

## 2017-09-11 ENCOUNTER — Ambulatory Visit (INDEPENDENT_AMBULATORY_CARE_PROVIDER_SITE_OTHER): Payer: Medicaid Other | Admitting: *Deleted

## 2017-09-11 VITALS — BP 101/63 | HR 93 | Wt 151.0 lb

## 2017-09-11 DIAGNOSIS — O09523 Supervision of elderly multigravida, third trimester: Secondary | ICD-10-CM

## 2017-09-11 DIAGNOSIS — O0993 Supervision of high risk pregnancy, unspecified, third trimester: Secondary | ICD-10-CM

## 2017-09-11 NOTE — Progress Notes (Signed)
Video interpreter Hind #[id number] used for encounter.  Pt reports increased fetal movement.

## 2017-09-11 NOTE — Patient Instructions (Signed)
Third Trimester of Pregnancy The third trimester is from week 28 through week 40 (months 7 through 9). The third trimester is a time when the unborn baby (fetus) is growing rapidly. At the end of the ninth month, the fetus is about 20 inches in length and weighs 6-10 pounds. Body changes during your third trimester Your body will continue to go through many changes during pregnancy. The changes vary from woman to woman. During the third trimester:  Your weight will continue to increase. You can expect to gain 25-35 pounds (11-16 kg) by the end of the pregnancy.  You may begin to get stretch marks on your hips, abdomen, and breasts.  You may urinate more often because the fetus is moving lower into your pelvis and pressing on your bladder.  You may develop or continue to have heartburn. This is caused by increased hormones that slow down muscles in the digestive tract.  You may develop or continue to have constipation because increased hormones slow digestion and cause the muscles that push waste through your intestines to relax.  You may develop hemorrhoids. These are swollen veins (varicose veins) in the rectum that can itch or be painful.  You may develop swollen, bulging veins (varicose veins) in your legs.  You may have increased body aches in the pelvis, back, or thighs. This is due to weight gain and increased hormones that are relaxing your joints.  You may have changes in your hair. These can include thickening of your hair, rapid growth, and changes in texture. Some women also have hair loss during or after pregnancy, or hair that feels dry or thin. Your hair will most likely return to normal after your baby is born.  Your breasts will continue to grow and they will continue to become tender. A yellow fluid (colostrum) may leak from your breasts. This is the first milk you are producing for your baby.  Your belly button may stick out.  You may notice more swelling in your hands,  face, or ankles.  You may have increased tingling or numbness in your hands, arms, and legs. The skin on your belly may also feel numb.  You may feel short of breath because of your expanding uterus.  You may have more problems sleeping. This can be caused by the size of your belly, increased need to urinate, and an increase in your body's metabolism.  You may notice the fetus "dropping," or moving lower in your abdomen (lightening).  You may have increased vaginal discharge.  You may notice your joints feel loose and you may have pain around your pelvic bone.  What to expect at prenatal visits You will have prenatal exams every 2 weeks until week 36. Then you will have weekly prenatal exams. During a routine prenatal visit:  You will be weighed to make sure you and the baby are growing normally.  Your blood pressure will be taken.  Your abdomen will be measured to track your baby's growth.  The fetal heartbeat will be listened to.  Any test results from the previous visit will be discussed.  You may have a cervical check near your due date to see if your cervix has softened or thinned (effaced).  You will be tested for Group B streptococcus. This happens between 35 and 37 weeks.  Your health care provider may ask you:  What your birth plan is.  How you are feeling.  If you are feeling the baby move.  If you have had   any abnormal symptoms, such as leaking fluid, bleeding, severe headaches, or abdominal cramping.  If you are using any tobacco products, including cigarettes, chewing tobacco, and electronic cigarettes.  If you have any questions.  Other tests or screenings that may be performed during your third trimester include:  Blood tests that check for low iron levels (anemia).  Fetal testing to check the health, activity level, and growth of the fetus. Testing is done if you have certain medical conditions or if there are problems during the  pregnancy.  Nonstress test (NST). This test checks the health of your baby to make sure there are no signs of problems, such as the baby not getting enough oxygen. During this test, a belt is placed around your belly. The baby is made to move, and its heart rate is monitored during movement.  What is false labor? False labor is a condition in which you feel small, irregular tightenings of the muscles in the womb (contractions) that usually go away with rest, changing position, or drinking water. These are called Braxton Hicks contractions. Contractions may last for hours, days, or even weeks before true labor sets in. If contractions come at regular intervals, become more frequent, increase in intensity, or become painful, you should see your health care provider. What are the signs of labor?  Abdominal cramps.  Regular contractions that start at 10 minutes apart and become stronger and more frequent with time.  Contractions that start on the top of the uterus and spread down to the lower abdomen and back.  Increased pelvic pressure and dull back pain.  A watery or bloody mucus discharge that comes from the vagina.  Leaking of amniotic fluid. This is also known as your "water breaking." It could be a slow trickle or a gush. Let your health care provider know if it has a color or strange odor. If you have any of these signs, call your health care provider right away, even if it is before your due date. Follow these instructions at home: Medicines  Follow your health care provider's instructions regarding medicine use. Specific medicines may be either safe or unsafe to take during pregnancy.  Take a prenatal vitamin that contains at least 600 micrograms (mcg) of folic acid.  If you develop constipation, try taking a stool softener if your health care provider approves. Eating and drinking  Eat a balanced diet that includes fresh fruits and vegetables, whole grains, good sources of protein  such as meat, eggs, or tofu, and low-fat dairy. Your health care provider will help you determine the amount of weight gain that is right for you.  Avoid raw meat and uncooked cheese. These carry germs that can cause birth defects in the baby.  If you have low calcium intake from food, talk to your health care provider about whether you should take a daily calcium supplement.  Eat four or five small meals rather than three large meals a day.  Limit foods that are high in fat and processed sugars, such as fried and sweet foods.  To prevent constipation: ? Drink enough fluid to keep your urine clear or pale yellow. ? Eat foods that are high in fiber, such as fresh fruits and vegetables, whole grains, and beans. Activity  Exercise only as directed by your health care provider. Most women can continue their usual exercise routine during pregnancy. Try to exercise for 30 minutes at least 5 days a week. Stop exercising if you experience uterine contractions.  Avoid heavy   lifting.  Do not exercise in extreme heat or humidity, or at high altitudes.  Wear low-heel, comfortable shoes.  Practice good posture.  You may continue to have sex unless your health care provider tells you otherwise. Relieving pain and discomfort  Take frequent breaks and rest with your legs elevated if you have leg cramps or low back pain.  Take warm sitz baths to soothe any pain or discomfort caused by hemorrhoids. Use hemorrhoid cream if your health care provider approves.  Wear a good support bra to prevent discomfort from breast tenderness.  If you develop varicose veins: ? Wear support pantyhose or compression stockings as told by your healthcare provider. ? Elevate your feet for 15 minutes, 3-4 times a day. Prenatal care  Write down your questions. Take them to your prenatal visits.  Keep all your prenatal visits as told by your health care provider. This is important. Safety  Wear your seat belt at  all times when driving.  Make a list of emergency phone numbers, including numbers for family, friends, the hospital, and police and fire departments. General instructions  Avoid cat litter boxes and soil used by cats. These carry germs that can cause birth defects in the baby. If you have a cat, ask someone to clean the litter box for you.  Do not travel far distances unless it is absolutely necessary and only with the approval of your health care provider.  Do not use hot tubs, steam rooms, or saunas.  Do not drink alcohol.  Do not use any products that contain nicotine or tobacco, such as cigarettes and e-cigarettes. If you need help quitting, ask your health care provider.  Do not use any medicinal herbs or unprescribed drugs. These chemicals affect the formation and growth of the baby.  Do not douche or use tampons or scented sanitary pads.  Do not cross your legs for long periods of time.  To prepare for the arrival of your baby: ? Take prenatal classes to understand, practice, and ask questions about labor and delivery. ? Make a trial run to the hospital. ? Visit the hospital and tour the maternity area. ? Arrange for maternity or paternity leave through employers. ? Arrange for family and friends to take care of pets while you are in the hospital. ? Purchase a rear-facing car seat and make sure you know how to install it in your car. ? Pack your hospital bag. ? Prepare the baby's nursery. Make sure to remove all pillows and stuffed animals from the baby's crib to prevent suffocation.  Visit your dentist if you have not gone during your pregnancy. Use a soft toothbrush to brush your teeth and be gentle when you floss. Contact a health care provider if:  You are unsure if you are in labor or if your water has broken.  You become dizzy.  You have mild pelvic cramps, pelvic pressure, or nagging pain in your abdominal area.  You have lower back pain.  You have persistent  nausea, vomiting, or diarrhea.  You have an unusual or bad smelling vaginal discharge.  You have pain when you urinate. Get help right away if:  Your water breaks before 37 weeks.  You have regular contractions less than 5 minutes apart before 37 weeks.  You have a fever.  You are leaking fluid from your vagina.  You have spotting or bleeding from your vagina.  You have severe abdominal pain or cramping.  You have rapid weight loss or weight gain.    You have shortness of breath with chest pain.  You notice sudden or extreme swelling of your face, hands, ankles, feet, or legs.  Your baby makes fewer than 10 movements in 2 hours.  You have severe headaches that do not go away when you take medicine.  You have vision changes. Summary  The third trimester is from week 28 through week 40, months 7 through 9. The third trimester is a time when the unborn baby (fetus) is growing rapidly.  During the third trimester, your discomfort may increase as you and your baby continue to gain weight. You may have abdominal, leg, and back pain, sleeping problems, and an increased need to urinate.  During the third trimester your breasts will keep growing and they will continue to become tender. A yellow fluid (colostrum) may leak from your breasts. This is the first milk you are producing for your baby.  False labor is a condition in which you feel small, irregular tightenings of the muscles in the womb (contractions) that eventually go away. These are called Braxton Hicks contractions. Contractions may last for hours, days, or even weeks before true labor sets in.  Signs of labor can include: abdominal cramps; regular contractions that start at 10 minutes apart and become stronger and more frequent with time; watery or bloody mucus discharge that comes from the vagina; increased pelvic pressure and dull back pain; and leaking of amniotic fluid. This information is not intended to replace advice  given to you by your health care provider. Make sure you discuss any questions you have with your health care provider. Document Released: 11/01/2001 Document Revised: 04/14/2016 Document Reviewed: 01/08/2013 Elsevier Interactive Patient Education  2017 [employer]  

## 2017-09-11 NOTE — Progress Notes (Signed)

## 2017-09-11 NOTE — Progress Notes (Signed)
   PRENATAL VISIT NOTE  Subjective:  Danielle Bridges is a 46 y.o. [id number] at 6283w4d being seen today for ongoing prenatal care.  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  Patient reports backache and occasional contractions. She states the back pain is the worst when the baby is moving.  Contractions: Not present. Vag. Bleeding: None.  Movement: Present. Denies leaking of fluid.   The following portions of the patient's history were reviewed and updated as appropriate: allergies, current medications, past family history, past medical history, past social history, past surgical history and problem list. Problem list updated.  Objective:   Vitals:   09/11/17 1308  BP: 101/63  Pulse: 93  Weight: 151 lb (68.5 kg)    Fetal Status: Fetal Heart Rate (bpm): NST Fundal Height: 37 cm Movement: Present     General:  Alert, oriented and cooperative. Patient is in no acute distress.  Skin: Skin is warm and dry. No rash noted.   Cardiovascular: Normal heart rate noted  Respiratory: Normal respiratory effort, no problems with respiration noted  Abdomen: Soft, gravid, appropriate for gestational age.  Pain/Pressure: Present     Pelvic: Cervical exam deferred        Extremities: Normal range of motion.  Edema: None  Mental Status:  Normal mood and affect. Normal behavior. Normal judgment and thought content.   Assessment and Plan:  Pregnancy: [id number] at 4183w4d  1. Supervision of high risk pregnancy, antepartum, third trimester Encouraged patient to use pregnancy support belt Encouraged to increase water intake   2. Elderly multigravida in third trimester BPP 10/10, AFI  8 Repeat in 1 week  Preterm labor symptoms and general obstetric precautions including but not limited to vaginal bleeding, contractions, leaking of fluid and fetal movement were reviewed in detail with the patient. Please refer to After Visit Summary for other counseling recommendations.  Return in about 7 days (around 09/18/2017) for NST/BPP & HOB.  Rolm BookbinderCaroline M Neill, CNM  09/11/17 1:59 PM

## 2017-09-18 ENCOUNTER — Ambulatory Visit (INDEPENDENT_AMBULATORY_CARE_PROVIDER_SITE_OTHER): Payer: Medicaid Other | Admitting: *Deleted

## 2017-09-18 ENCOUNTER — Ambulatory Visit (INDEPENDENT_AMBULATORY_CARE_PROVIDER_SITE_OTHER): Payer: Medicaid Other | Admitting: Advanced Practice Midwife

## 2017-09-18 ENCOUNTER — Other Ambulatory Visit: Payer: Medicaid Other

## 2017-09-18 ENCOUNTER — Ambulatory Visit: Payer: Self-pay

## 2017-09-18 VITALS — BP 102/54 | HR 87 | Wt 153.5 lb

## 2017-09-18 DIAGNOSIS — O09523 Supervision of elderly multigravida, third trimester: Secondary | ICD-10-CM

## 2017-09-18 DIAGNOSIS — O0993 Supervision of high risk pregnancy, unspecified, third trimester: Secondary | ICD-10-CM

## 2017-09-18 DIAGNOSIS — O0990 Supervision of high risk pregnancy, unspecified, unspecified trimester: Secondary | ICD-10-CM

## 2017-09-18 NOTE — Progress Notes (Signed)
Video interpreter Asmaa [phone]#[phone] used for encounter.

## 2017-09-18 NOTE — Progress Notes (Signed)

## 2017-09-18 NOTE — Progress Notes (Signed)
   PRENATAL VISIT NOTE  Subjective:  Melody HaverHala Aston is a 46 y.o. [id number] at 60106w4d being seen today for ongoing prenatal care.  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  Patient reports no complaints.  Contractions: Irregular. Vag. Bleeding: None.  Movement: Present. Denies leaking of fluid.   The following portions of the patient's history were reviewed and updated as appropriate: allergies, current medications, past family history, past medical history, past social history, past surgical history and problem list. Problem list updated.  Arabic int # [id number]  Objective:   Vitals:   09/18/17 1403  BP: (!) 102/54  Pulse: 87  Weight: 153 lb 8 oz (69.6 kg)    Fetal Status: Fetal Heart Rate (bpm): NST Fundal Height: 37 cm Movement: Present     General:  Alert, oriented and cooperative. Patient is in no acute distress.  Skin: Skin is warm and dry. No rash noted.   Cardiovascular: Normal heart rate noted  Respiratory: Normal respiratory effort, no problems with respiration noted  Abdomen: Soft, gravid, appropriate for gestational age.  Pain/Pressure: Present     Pelvic: Cervical exam deferred        Extremities: Normal range of motion.  Edema: None  Mental Status:  Normal mood and affect. Normal behavior. Normal judgment and thought content.   FHT: 145, moderate with 15x15 accels, no decels Toco: UI BPP: 10/10  Assessment and Plan:  Pregnancy: [id number] at 22106w4d  1. Supervision of high risk pregnancy, antepartum, third trimester - Weekly BPP/NST -Delivery by 40 weeks   2. Elderly multigravida in third trimester   3. Supervision of high risk pregnancy,  antepartum   Term labor symptoms and general obstetric precautions including but not limited to vaginal bleeding, contractions, leaking of fluid and fetal movement were reviewed in detail with the patient. Please refer to After Visit Summary for other counseling recommendations.  Return in about 1 week (around 09/25/2017) for as scheduled.   Thressa ShellerHeather Jolly Bleicher, CNM

## 2017-09-18 NOTE — Patient Instructions (Signed)
Third Trimester of Pregnancy The third trimester is from week 28 through week 40 (months 7 through 9). The third trimester is a time when the unborn baby (fetus) is growing rapidly. At the end of the ninth month, the fetus is about 20 inches in length and weighs 6-10 pounds. Body changes during your third trimester Your body will continue to go through many changes during pregnancy. The changes vary from woman to woman. During the third trimester:  Your weight will continue to increase. You can expect to gain 25-35 pounds (11-16 kg) by the end of the pregnancy.  You may begin to get stretch marks on your hips, abdomen, and breasts.  You may urinate more often because the fetus is moving lower into your pelvis and pressing on your bladder.  You may develop or continue to have heartburn. This is caused by increased hormones that slow down muscles in the digestive tract.  You may develop or continue to have constipation because increased hormones slow digestion and cause the muscles that push waste through your intestines to relax.  You may develop hemorrhoids. These are swollen veins (varicose veins) in the rectum that can itch or be painful.  You may develop swollen, bulging veins (varicose veins) in your legs.  You may have increased body aches in the pelvis, back, or thighs. This is due to weight gain and increased hormones that are relaxing your joints.  You may have changes in your hair. These can include thickening of your hair, rapid growth, and changes in texture. Some women also have hair loss during or after pregnancy, or hair that feels dry or thin. Your hair will most likely return to normal after your baby is born.  Your breasts will continue to grow and they will continue to become tender. A yellow fluid (colostrum) may leak from your breasts. This is the first milk you are producing for your baby.  Your belly button may stick out.  You may notice more swelling in your hands,  face, or ankles.  You may have increased tingling or numbness in your hands, arms, and legs. The skin on your belly may also feel numb.  You may feel short of breath because of your expanding uterus.  You may have more problems sleeping. This can be caused by the size of your belly, increased need to urinate, and an increase in your body's metabolism.  You may notice the fetus "dropping," or moving lower in your abdomen (lightening).  You may have increased vaginal discharge.  You may notice your joints feel loose and you may have pain around your pelvic bone.  What to expect at prenatal visits You will have prenatal exams every 2 weeks until week 36. Then you will have weekly prenatal exams. During a routine prenatal visit:  You will be weighed to make sure you and the baby are growing normally.  Your blood pressure will be taken.  Your abdomen will be measured to track your baby's growth.  The fetal heartbeat will be listened to.  Any test results from the previous visit will be discussed.  You may have a cervical check near your due date to see if your cervix has softened or thinned (effaced).  You will be tested for Group B streptococcus. This happens between 35 and 37 weeks.  Your health care provider may ask you:  What your birth plan is.  How you are feeling.  If you are feeling the baby move.  If you have had   any abnormal symptoms, such as leaking fluid, bleeding, severe headaches, or abdominal cramping.  If you are using any tobacco products, including cigarettes, chewing tobacco, and electronic cigarettes.  If you have any questions.  Other tests or screenings that may be performed during your third trimester include:  Blood tests that check for low iron levels (anemia).  Fetal testing to check the health, activity level, and growth of the fetus. Testing is done if you have certain medical conditions or if there are problems during the  pregnancy.  Nonstress test (NST). This test checks the health of your baby to make sure there are no signs of problems, such as the baby not getting enough oxygen. During this test, a belt is placed around your belly. The baby is made to move, and its heart rate is monitored during movement.  What is false labor? False labor is a condition in which you feel small, irregular tightenings of the muscles in the womb (contractions) that usually go away with rest, changing position, or drinking water. These are called Braxton Hicks contractions. Contractions may last for hours, days, or even weeks before true labor sets in. If contractions come at regular intervals, become more frequent, increase in intensity, or become painful, you should see your health care provider. What are the signs of labor?  Abdominal cramps.  Regular contractions that start at 10 minutes apart and become stronger and more frequent with time.  Contractions that start on the top of the uterus and spread down to the lower abdomen and back.  Increased pelvic pressure and dull back pain.  A watery or bloody mucus discharge that comes from the vagina.  Leaking of amniotic fluid. This is also known as your "water breaking." It could be a slow trickle or a gush. Let your health care provider know if it has a color or strange odor. If you have any of these signs, call your health care provider right away, even if it is before your due date. Follow these instructions at home: Medicines  Follow your health care provider's instructions regarding medicine use. Specific medicines may be either safe or unsafe to take during pregnancy.  Take a prenatal vitamin that contains at least 600 micrograms (mcg) of folic acid.  If you develop constipation, try taking a stool softener if your health care provider approves. Eating and drinking  Eat a balanced diet that includes fresh fruits and vegetables, whole grains, good sources of protein  such as meat, eggs, or tofu, and low-fat dairy. Your health care provider will help you determine the amount of weight gain that is right for you.  Avoid raw meat and uncooked cheese. These carry germs that can cause birth defects in the baby.  If you have low calcium intake from food, talk to your health care provider about whether you should take a daily calcium supplement.  Eat four or five small meals rather than three large meals a day.  Limit foods that are high in fat and processed sugars, such as fried and sweet foods.  To prevent constipation: ? Drink enough fluid to keep your urine clear or pale yellow. ? Eat foods that are high in fiber, such as fresh fruits and vegetables, whole grains, and beans. Activity  Exercise only as directed by your health care provider. Most women can continue their usual exercise routine during pregnancy. Try to exercise for 30 minutes at least 5 days a week. Stop exercising if you experience uterine contractions.  Avoid heavy   lifting.  Do not exercise in extreme heat or humidity, or at high altitudes.  Wear low-heel, comfortable shoes.  Practice good posture.  You may continue to have sex unless your health care provider tells you otherwise. Relieving pain and discomfort  Take frequent breaks and rest with your legs elevated if you have leg cramps or low back pain.  Take warm sitz baths to soothe any pain or discomfort caused by hemorrhoids. Use hemorrhoid cream if your health care provider approves.  Wear a good support bra to prevent discomfort from breast tenderness.  If you develop varicose veins: ? Wear support pantyhose or compression stockings as told by your healthcare provider. ? Elevate your feet for 15 minutes, 3-4 times a day. Prenatal care  Write down your questions. Take them to your prenatal visits.  Keep all your prenatal visits as told by your health care provider. This is important. Safety  Wear your seat belt at  all times when driving.  Make a list of emergency phone numbers, including numbers for family, friends, the hospital, and police and fire departments. General instructions  Avoid cat litter boxes and soil used by cats. These carry germs that can cause birth defects in the baby. If you have a cat, ask someone to clean the litter box for you.  Do not travel far distances unless it is absolutely necessary and only with the approval of your health care provider.  Do not use hot tubs, steam rooms, or saunas.  Do not drink alcohol.  Do not use any products that contain nicotine or tobacco, such as cigarettes and e-cigarettes. If you need help quitting, ask your health care provider.  Do not use any medicinal herbs or unprescribed drugs. These chemicals affect the formation and growth of the baby.  Do not douche or use tampons or scented sanitary pads.  Do not cross your legs for long periods of time.  To prepare for the arrival of your baby: ? Take prenatal classes to understand, practice, and ask questions about labor and delivery. ? Make a trial run to the hospital. ? Visit the hospital and tour the maternity area. ? Arrange for maternity or paternity leave through employers. ? Arrange for family and friends to take care of pets while you are in the hospital. ? Purchase a rear-facing car seat and make sure you know how to install it in your car. ? Pack your hospital bag. ? Prepare the baby's nursery. Make sure to remove all pillows and stuffed animals from the baby's crib to prevent suffocation.  Visit your dentist if you have not gone during your pregnancy. Use a soft toothbrush to brush your teeth and be gentle when you floss. Contact a health care provider if:  You are unsure if you are in labor or if your water has broken.  You become dizzy.  You have mild pelvic cramps, pelvic pressure, or nagging pain in your abdominal area.  You have lower back pain.  You have persistent  nausea, vomiting, or diarrhea.  You have an unusual or bad smelling vaginal discharge.  You have pain when you urinate. Get help right away if:  Your water breaks before 37 weeks.  You have regular contractions less than 5 minutes apart before 37 weeks.  You have a fever.  You are leaking fluid from your vagina.  You have spotting or bleeding from your vagina.  You have severe abdominal pain or cramping.  You have rapid weight loss or weight gain.    You have shortness of breath with chest pain.  You notice sudden or extreme swelling of your face, hands, ankles, feet, or legs.  Your baby makes fewer than 10 movements in 2 hours.  You have severe headaches that do not go away when you take medicine.  You have vision changes. Summary  The third trimester is from week 28 through week 40, months 7 through 9. The third trimester is a time when the unborn baby (fetus) is growing rapidly.  During the third trimester, your discomfort may increase as you and your baby continue to gain weight. You may have abdominal, leg, and back pain, sleeping problems, and an increased need to urinate.  During the third trimester your breasts will keep growing and they will continue to become tender. A yellow fluid (colostrum) may leak from your breasts. This is the first milk you are producing for your baby.  False labor is a condition in which you feel small, irregular tightenings of the muscles in the womb (contractions) that eventually go away. These are called Braxton Hicks contractions. Contractions may last for hours, days, or even weeks before true labor sets in.  Signs of labor can include: abdominal cramps; regular contractions that start at 10 minutes apart and become stronger and more frequent with time; watery or bloody mucus discharge that comes from the vagina; increased pelvic pressure and dull back pain; and leaking of amniotic fluid. This information is not intended to replace advice  given to you by your health care provider. Make sure you discuss any questions you have with your health care provider. Document Released: 11/01/2001 Document Revised: 04/14/2016 Document Reviewed: 01/08/2013 Elsevier Interactive Patient Education  2017 [employer]  

## 2017-09-20 ENCOUNTER — Inpatient Hospital Stay (HOSPITAL_COMMUNITY): Payer: Medicaid Other | Admitting: Anesthesiology

## 2017-09-20 ENCOUNTER — Encounter (HOSPITAL_COMMUNITY): Payer: Self-pay

## 2017-09-20 ENCOUNTER — Inpatient Hospital Stay (HOSPITAL_COMMUNITY)
Admission: AD | Admit: 2017-09-20 | Discharge: 2017-09-22 | DRG: 807 | Disposition: A | Discharge status: Home / Self Care | Payer: Medicaid Other | Source: Ambulatory Visit | Attending: Family Medicine | Admitting: Family Medicine

## 2017-09-20 DIAGNOSIS — N90813 Female genital mutilation Type III status: Secondary | ICD-10-CM | POA: Diagnosis present

## 2017-09-20 DIAGNOSIS — O3473 Maternal care for abnormality of vulva and perineum, third trimester: Principal | ICD-10-CM | POA: Diagnosis present

## 2017-09-20 DIAGNOSIS — O4292 Full-term premature rupture of membranes, unspecified as to length of time between rupture and onset of labor: Secondary | ICD-10-CM | POA: Diagnosis present

## 2017-09-20 DIAGNOSIS — O98819 Other maternal infectious and parasitic diseases complicating pregnancy, unspecified trimester: Secondary | ICD-10-CM

## 2017-09-20 DIAGNOSIS — Z3483 Encounter for supervision of other normal pregnancy, third trimester: Secondary | ICD-10-CM | POA: Diagnosis present

## 2017-09-20 DIAGNOSIS — O09523 Supervision of elderly multigravida, third trimester: Secondary | ICD-10-CM

## 2017-09-20 DIAGNOSIS — A749 Chlamydial infection, unspecified: Secondary | ICD-10-CM

## 2017-09-20 DIAGNOSIS — Z3A37 37 weeks gestation of pregnancy: Secondary | ICD-10-CM

## 2017-09-20 DIAGNOSIS — Z843 Family history of consanguinity: Secondary | ICD-10-CM

## 2017-09-20 HISTORY — DX: Chlamydial infection, unspecified: O98.819

## 2017-09-20 HISTORY — DX: Chlamydial infection, unspecified: A74.9

## 2017-09-20 LAB — TYPE AND SCREEN
ABO/RH(D): A POS
Antibody Screen: NEGATIVE

## 2017-09-20 LAB — CBC
HEMATOCRIT: 44.2 % (ref 36.0–46.0)
HEMOGLOBIN: 14.9 g/dL (ref 12.0–15.0)
MCH: 30 pg (ref 26.0–34.0)
MCHC: 33.7 g/dL (ref 30.0–36.0)
MCV: 89.1 fL (ref 78.0–100.0)
Platelets: 208 10*3/uL (ref 150–400)
RBC: 4.96 MIL/uL (ref 3.87–5.11)
RDW: 13.2 % (ref 11.5–15.5)
WBC: 10.9 10*3/uL — ABNORMAL HIGH (ref 4.0–10.5)

## 2017-09-20 LAB — ABO/RH: ABO/RH(D): A POS

## 2017-09-20 LAB — POCT FERN TEST: POCT Fern Test: POSITIVE

## 2017-09-20 MED ORDER — SOD CITRATE-CITRIC ACID 500-334 MG/5ML PO SOLN: 30.0000 mL | ORAL | Status: DC | PRN

## 2017-09-20 MED ORDER — EPHEDRINE 5 MG/ML INJ
10.0000 mg | INTRAVENOUS | Status: DC | PRN
  Filled 2017-09-20: qty 2
  Filled 2017-09-20: qty 4

## 2017-09-20 MED ORDER — OXYTOCIN BOLUS FROM INFUSION
500.0000 mL | Freq: Once | INTRAVENOUS | Status: AC
  Administered 2017-09-20: 500 mL via INTRAVENOUS

## 2017-09-20 MED ORDER — FENTANYL 2.5 MCG/ML BUPIVACAINE 1/10 % EPIDURAL INFUSION (WH - ANES)
INTRAMUSCULAR | Status: AC
  Filled 2017-09-20: qty 100

## 2017-09-20 MED ORDER — SIMETHICONE 80 MG PO CHEW: 80.0000 mg | CHEWABLE_TABLET | ORAL | Status: DC | PRN

## 2017-09-20 MED ORDER — PHENYLEPHRINE 40 MCG/ML (10ML) SYRINGE FOR IV PUSH (FOR BLOOD PRESSURE SUPPORT)
80.0000 ug | PREFILLED_SYRINGE | INTRAVENOUS | Status: DC | PRN
Start: 2017-09-20 — End: 2017-09-20
  Administered 2017-09-20 (×2): 80 ug via INTRAVENOUS
  Filled 2017-09-20: qty 5

## 2017-09-20 MED ORDER — ONDANSETRON HCL 4 MG PO TABS: 4.0000 mg | ORAL_TABLET | ORAL | Status: DC | PRN

## 2017-09-20 MED ORDER — OXYCODONE-ACETAMINOPHEN 5-325 MG PO TABS: 1.0000 | ORAL_TABLET | ORAL | Status: DC | PRN

## 2017-09-20 MED ORDER — ACETAMINOPHEN 325 MG PO TABS: 650.0000 mg | ORAL_TABLET | ORAL | Status: DC | PRN

## 2017-09-20 MED ORDER — COCONUT OIL OIL: 1.0000 "application " | TOPICAL_OIL | Status: DC | PRN

## 2017-09-20 MED ORDER — DIBUCAINE 1 % RE OINT: 1.0000 "application " | TOPICAL_OINTMENT | RECTAL | Status: DC | PRN

## 2017-09-20 MED ORDER — LACTATED RINGERS IV SOLN
INTRAVENOUS | Status: DC
  Administered 2017-09-20: 11:00:00 via INTRAVENOUS

## 2017-09-20 MED ORDER — EPHEDRINE 5 MG/ML INJ
10.0000 mg | INTRAVENOUS | Status: DC | PRN
  Administered 2017-09-20: 10 mg via INTRAVENOUS
  Filled 2017-09-20: qty 2

## 2017-09-20 MED ORDER — LIDOCAINE-EPINEPHRINE (PF) 2 %-1:200000 IJ SOLN
INTRAMUSCULAR | Status: DC | PRN
  Administered 2017-09-20 (×2): 5 mL via EPIDURAL

## 2017-09-20 MED ORDER — PHENYLEPHRINE 40 MCG/ML (10ML) SYRINGE FOR IV PUSH (FOR BLOOD PRESSURE SUPPORT)
80.0000 ug | PREFILLED_SYRINGE | INTRAVENOUS | Status: AC | PRN
  Administered 2017-09-20 (×3): 80 ug via INTRAVENOUS

## 2017-09-20 MED ORDER — ONDANSETRON HCL 4 MG/2ML IJ SOLN: 4.0000 mg | Freq: Four times a day (QID) | INTRAMUSCULAR | Status: DC | PRN

## 2017-09-20 MED ORDER — FENTANYL CITRATE (PF) 100 MCG/2ML IJ SOLN
50.0000 ug | INTRAMUSCULAR | Status: DC | PRN
  Administered 2017-09-20: 50 ug via INTRAVENOUS
  Administered 2017-09-20: 100 ug via INTRAVENOUS
  Filled 2017-09-20 (×2): qty 2

## 2017-09-20 MED ORDER — DIPHENHYDRAMINE HCL 25 MG PO CAPS: 25.0000 mg | ORAL_CAPSULE | Freq: Four times a day (QID) | ORAL | Status: DC | PRN

## 2017-09-20 MED ORDER — LACTATED RINGERS IV SOLN: 500.0000 mL | Freq: Once | INTRAVENOUS | Status: DC

## 2017-09-20 MED ORDER — IBUPROFEN 600 MG PO TABS
600.0000 mg | ORAL_TABLET | Freq: Four times a day (QID) | ORAL | Status: DC
  Administered 2017-09-21 – 2017-09-22 (×7): 600 mg via ORAL
  Filled 2017-09-20 (×7): qty 1

## 2017-09-20 MED ORDER — LIDOCAINE HCL (PF) 1 % IJ SOLN
30.0000 mL | INTRAMUSCULAR | Status: DC | PRN
  Filled 2017-09-20: qty 30

## 2017-09-20 MED ORDER — PROMETHAZINE HCL 25 MG/ML IJ SOLN
12.5000 mg | Freq: Four times a day (QID) | INTRAMUSCULAR | Status: DC | PRN
  Administered 2017-09-20: 12.5 mg via INTRAVENOUS
  Filled 2017-09-20: qty 1

## 2017-09-20 MED ORDER — PHENYLEPHRINE 40 MCG/ML (10ML) SYRINGE FOR IV PUSH (FOR BLOOD PRESSURE SUPPORT)
PREFILLED_SYRINGE | INTRAVENOUS | Status: AC
  Filled 2017-09-20: qty 20

## 2017-09-20 MED ORDER — BENZOCAINE-MENTHOL 20-0.5 % EX AERO
1.0000 "application " | INHALATION_SPRAY | CUTANEOUS | Status: DC | PRN
  Administered 2017-09-20: 1 via TOPICAL
  Filled 2017-09-20: qty 56

## 2017-09-20 MED ORDER — LACTATED RINGERS IV SOLN
500.0000 mL | INTRAVENOUS | Status: DC | PRN
  Administered 2017-09-20: 500 mL via INTRAVENOUS

## 2017-09-20 MED ORDER — BUTORPHANOL TARTRATE 1 MG/ML IJ SOLN
2.0000 mg | Freq: Once | INTRAMUSCULAR | Status: AC
  Administered 2017-09-20: 2 mg via INTRAVENOUS
  Filled 2017-09-20: qty 2

## 2017-09-20 MED ORDER — PRENATAL MULTIVITAMIN CH
1.0000 | ORAL_TABLET | Freq: Every day | ORAL | Status: DC
  Administered 2017-09-21 – 2017-09-22 (×2): 1 via ORAL
  Filled 2017-09-20 (×2): qty 1

## 2017-09-20 MED ORDER — ONDANSETRON HCL 4 MG/2ML IJ SOLN: 4.0000 mg | INTRAMUSCULAR | Status: DC | PRN

## 2017-09-20 MED ORDER — LIDOCAINE HCL (PF) 1 % IJ SOLN
INTRAMUSCULAR | Status: DC | PRN
  Administered 2017-09-20 (×2): 4 mL via EPIDURAL

## 2017-09-20 MED ORDER — DIPHENHYDRAMINE HCL 50 MG/ML IJ SOLN: 12.5000 mg | INTRAMUSCULAR | Status: DC | PRN

## 2017-09-20 MED ORDER — FENTANYL 2.5 MCG/ML BUPIVACAINE 1/10 % EPIDURAL INFUSION (WH - ANES)
14.0000 mL/h | INTRAMUSCULAR | Status: DC | PRN
  Administered 2017-09-20 (×2): 14 mL/h via EPIDURAL
  Filled 2017-09-20: qty 100

## 2017-09-20 MED ORDER — SENNOSIDES-DOCUSATE SODIUM 8.6-50 MG PO TABS
2.0000 | ORAL_TABLET | ORAL | Status: DC
  Administered 2017-09-21 (×2): 2 via ORAL
  Filled 2017-09-20 (×2): qty 2

## 2017-09-20 MED ORDER — WITCH HAZEL-GLYCERIN EX PADS: 1.0000 "application " | MEDICATED_PAD | CUTANEOUS | Status: DC | PRN

## 2017-09-20 MED ORDER — LIDOCAINE-EPINEPHRINE (PF) 1 %-1:200000 IJ SOLN
10.0000 mL | Freq: Once | INTRAMUSCULAR | Status: DC
  Filled 2017-09-20: qty 10

## 2017-09-20 MED ORDER — ZOLPIDEM TARTRATE 5 MG PO TABS: 5.0000 mg | ORAL_TABLET | Freq: Every evening | ORAL | Status: DC | PRN

## 2017-09-20 MED ORDER — OXYCODONE-ACETAMINOPHEN 5-325 MG PO TABS: 2.0000 | ORAL_TABLET | ORAL | Status: DC | PRN

## 2017-09-20 MED ORDER — TERBUTALINE SULFATE 1 MG/ML IJ SOLN
0.2500 mg | Freq: Once | INTRAMUSCULAR | Status: DC | PRN
  Filled 2017-09-20: qty 1

## 2017-09-20 MED ORDER — TETANUS-DIPHTH-ACELL PERTUSSIS 5-2.5-18.5 LF-MCG/0.5 IM SUSP: 0.5000 mL | Freq: Once | INTRAMUSCULAR | Status: DC

## 2017-09-20 MED ORDER — OXYTOCIN 40 UNITS IN LACTATED RINGERS INFUSION - SIMPLE MED
1.0000 m[IU]/min | INTRAVENOUS | Status: DC
  Administered 2017-09-20: 2 m[IU]/min via INTRAVENOUS
  Filled 2017-09-20: qty 1000

## 2017-09-20 MED ORDER — OXYTOCIN 40 UNITS IN LACTATED RINGERS INFUSION - SIMPLE MED: 2.5000 [IU]/h | INTRAVENOUS | Status: DC

## 2017-09-20 NOTE — Progress Notes (Signed)
Went to see patient. Now comfortable after placing second epidural. Category 1 fetal heart tracing. Discussed defibulation with patient. Shared decision to defer until delivery and perform if necessary. Unable to cath currently but patient thinks she will be able to void spontaneously. If unable to do so and retaining urine will need to defibulate sufficient to introduce urinary catheter. Materials are in the room.

## 2017-09-20 NOTE — MAU Note (Signed)
Pt having ctx. 

## 2017-09-20 NOTE — Anesthesia Preprocedure Evaluation (Signed)
Anesthesia Evaluation  Patient identified by MRN, date of birth, ID band Patient awake    Reviewed: Allergy & Precautions, Patient's Chart, lab work & pertinent test results  Airway Mallampati: III  TM Distance: >3 FB Neck ROM: Full    Dental no notable dental hx. (+) Teeth Intact   Pulmonary neg pulmonary ROS,    Pulmonary exam normal breath sounds clear to auscultation       Cardiovascular negative cardio ROS Normal cardiovascular exam Rhythm:Regular Rate:Normal     Neuro/Psych negative neurological ROS  negative psych ROS   GI/Hepatic Neg liver ROS, GERD  ,  Endo/Other  negative endocrine ROS  Renal/GU negative Renal ROS  negative genitourinary   Musculoskeletal negative musculoskeletal ROS (+)   Abdominal   Peds  Hematology negative hematology ROS (+)   Anesthesia Other Findings   Reproductive/Obstetrics (+) Pregnancy AMA                             Anesthesia Physical Anesthesia Plan  ASA: II  Anesthesia Plan: Epidural   Post-op Pain Management:    Induction:   PONV Risk Score and Plan:   Airway Management Planned: Natural Airway  Additional Equipment:   Intra-op Plan:   Post-operative Plan:   Informed Consent: I have reviewed the patients History and Physical, chart, labs and discussed the procedure including the risks, benefits and alternatives for the proposed anesthesia with the patient or authorized representative who has indicated his/her understanding and acceptance.     Plan Discussed with: Anesthesiologist  Anesthesia Plan Comments:         Anesthesia Quick Evaluation

## 2017-09-20 NOTE — H&P (Signed)
OBSTETRIC ADMISSION HISTORY AND PHYSICAL  Danielle HaverHala Bridges is a 46 y.o. female [phone] with IUP at 6868w6d by LMP (12/29/16)  presenting for SOL and SROM@0800 . She reports +FMs, No LOF, no VB, no blurry vision, headaches or peripheral edema, and RUQ pain.  She plans on breast feeding. She request OCPs for birth control. 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 Chlamydia infection affecting pregnancy     Past Surgical History: Past Surgical History:  Procedure Laterality Date  . INCISION AND DRAINAGE    . OTHER SURGICAL HISTORY     femal circumcision  . THYROIDECTOMY N/A    Patient has not had thyroid removed  . TONSILLECTOMY      Obstetrical History: OB History    Gravida Para Term Preterm AB Living   5 3 3   1 3    SAB TAB Ectopic Multiple Live Births   1       3      Social History: Social History   Social History  . Marital status: Married    Spouse name: N/A  . Number of children: N/A  . Years of education: N/A   Social History Main Topics  . Smoking status: Never Smoker  . Smokeless tobacco: Never Used  . Alcohol use No  . Drug use: No  . Sexual activity: Yes    Birth control/ protection: None     Comment: married   Other Topics Concern  . None   Social History Narrative  . None    Family History: Family History  Problem Relation Age of Onset  . Hypertension Mother     Allergies: No Known Allergies  Prescriptions Prior to Admission  Medication Sig Dispense  Refill Last Dose  . Prenat w/o A Vit-FeFum-FePo-FA (CONCEPT OB) 130-92.4-1 MG CAPS Take 1 capsule by mouth daily. 30 capsule 11 Past Week at Unknown time     Review of Systems   All systems reviewed and negative except as stated in HPI  Blood pressure (!) 116/51, pulse 71, temperature (!) 97.5 F (36.4 C), resp. rate 20, last menstrual period 12/29/2016. General appearance: patient in pain.  alert, cooperative and appears stated age Lungs: clear to auscultation bilaterally Heart: regular rate and rhythm Abdomen: soft, non-tender; bowel sounds normal Extremities: Homans sign is negative, no sign of DVT Presentation: cephalic Fetal monitoringBaseline: 130 bpm, Variability: Good {> 6 bpm) and Accelerations: Reactive Uterine activityFrequency: Every 4 minutes and Intensity: strong Dilation: 2 Effacement (%): 80 Exam by:: Royetta AsalL Wilson CNM   Prenatal labs: ABO, Rh: A/Positive/-- (05/23 1541) Antibody: Negative (05/23 1541) Rubella: 5.65 (05/23 1541) RPR: Non Reactive (08/22 0938)  HBsAg: Negative (05/23 1541)  HIV:    Non Reactive (08/22 0938_ GBS: Negative (10/18 1544)  2 hr Glucola: fasting 83, 1hr 135, 2hr 129 Genetic screening  unknown Anatomy US normal (08/31/17)  Prenatal Transfer Tool  Maternal Diabetes: No Genetic Screening: unknown Maternal Ultrasounds/Referrals: Normal Fetal Ultrasounds or other Referrals:  None Maternal Substance Abuse:  No Significant Maternal Medications:  None Significant Maternal Lab Results: Lab values include: Group B Strep  negative  Results for orders placed or performed during the hospital encounter of 09/20/17 (from the past 24 hour(s))  POCT fern test   Collection Time: 09/20/17 10:56 AM  Result Value Ref Range   POCT Fern Test Positive = ruptured amniotic membanes     Patient Active Problem List   Diagnosis Date Noted  . Advanced maternal age in multigravida 05/11/2017  . Family history of consanguinity 05/11/2017  . Chlamydia  infection affecting pregnancy, antepartum 04/18/2017  . Supervision of high risk pregnancy, antepartum 04/12/2017  . 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 Language barrier, cultural differences 04/12/2017    Assessment/Plan:  Danielle Bridges is a 46 y.o. [id number] at [redacted]w[redacted]d here for SOL and SROM@0800   #Labor: Latent labor SROM@0800  #Pain: Per request of patient  (epidural #FWB: cat1 #ID:  GBS (neg) #MOF: breast and bottle #MOC: ocps #Circ:  n/a  Suella Broad, MD  09/20/2017, 11:01 AM  CNM attestation:  I have seen and examined this patient; I agree with above documentation in the resident's note.   Danielle Bridges is a 46 y.o. [id number] here for SROM/latent labor  PE: BP (!) 111/97   Pulse 72   Temp 98 F (36.7 C) (Oral)   Resp 16   Ht 5\' 3"  (1.6 m)   Wt 69.9 kg (154 lb)   LMP 12/29/2016   SpO2 100%   BMI 27.28 kg/m   Resp: normal effort, no distress Abd: gravid  ROS, labs, PMH reviewed  Plan: Admit to [employer] Expectant management Anticipate SVD Aware of FGM- will make a plan at delivery (prenatal records states it has been cut and repaired with each previous birth)  Cam Hai CNM 09/20/2017, 3:05 PM

## 2017-09-20 NOTE — Progress Notes (Signed)
Patient ID: Melody HaverHala Mosco, female   DOB: 11/11/1971, 46 y.o.   MRN: [id number]  CTSP for FHR decel to 90s @ 1744. Was noted to be 9cm. Now feeling a lot of pressure and is 10cm. Will begin pushing now. Expect SVD.  Cam HaiSHAW, KIMBERLY CNM 09/20/2017 6:08 PM

## 2017-09-20 NOTE — Progress Notes (Signed)
Patient ID: Danielle Bridges, female   DOB: 03/22/1971, 46 y.o.   MRN: [id number]  Has had epidural redose x 1 and still very uncomfortable; points to her low mid abd; no hx C/S; no vag bldg; pt and spouse concerned as she has not had this much discomfort with labor and has had previous unmedicated births; foley had attempted to be placed without success x 2  BP 92/74, other VSS FHR 120s, +accels, was having early variables Ctx q 1.5-2 mins, spont Cx 4/80/-2; thick, thick MSF present Internals placed  IUP@term  AMA SROM Latent labor GBS neg  Dr Ashok PallWouk in to assess infibulation; plan to have epidural replaced and then will cut infibulation in order to visualize urethra and place foley  Cam HaiSHAW, KIMBERLY CNM 09/20/2017 3:04 PM

## 2017-09-20 NOTE — Anesthesia Procedure Notes (Addendum)
Epidural Patient location during procedure: OB Start time: 09/20/2017 11:55 AM  Staffing Anesthesiologist: Mal AmabileFOSTER, Alissia Lory Performed: anesthesiologist   Preanesthetic Checklist Completed: patient identified, site marked, surgical consent, pre-op evaluation, timeout performed, IV checked, risks and benefits discussed and monitors and equipment checked  Epidural Patient position: sitting Prep: site prepped and draped and DuraPrep Patient monitoring: continuous pulse ox and blood pressure Approach: midline Location: L3-L4 Injection technique: LOR air  Needle:  Needle type: Tuohy  Needle gauge: 17 G Needle length: 9 cm and 9 Needle insertion depth: 5 cm cm Catheter type: closed end flexible Catheter size: 19 Gauge Catheter at skin depth: 10 cm Test dose: negative and Other  Assessment Events: blood not aspirated, injection not painful, no injection resistance, negative IV test and no paresthesia  Additional Notes Patient identified. Risks and benefits discussed including failed block, incomplete  Pain control, post dural puncture headache, nerve damage, paralysis, blood pressure Changes, nausea, vomiting, reactions to medications-both toxic and allergic and post Partum back pain. All questions were answered. Patient expressed understanding and wished to proceed. Sterile technique was used throughout procedure. Epidural site was Dressed with sterile barrier dressing. No paresthesias, signs of intravascular injection Or signs of intrathecal spread were encountered. Print production plannerArabic translator used throughout procedure.  Patient was more comfortable after the epidural was dosed. Please see RN's note for documentation of vital signs and FHR which are stable.

## 2017-09-20 NOTE — Anesthesia Pain Management Evaluation Note (Signed)
  [facility] Pain Management Visit Note  Patient: Danielle Bridges, 46 y.o., female  "Hello [facility] am [facility]. We have an anesthesia team available at all times to provide care throughout the hospital, including epidural management and anesthesia for C-section. I don't know your plan for the delivery whether it a natural birth, water birth, IV sedation, nitrous supplementation, doula or epidural, but we want to meet your pain goals."   1.Was your pain managed to your expectations on prior hospitalizations?   No prior hospitalizations  2.What is your expectation for pain management during this hospitalization?     Epidural  3.How can we help you reach that goal? epidural  Record the patient's initial score and the patient's pain goal.   Pain: 0  Pain Goal: 3 [facility] wants you to be able to say your pain was always managed very well.  Shahrzad Koble 09/20/2017

## 2017-09-20 NOTE — MAU Provider Note (Signed)
Chief Complaint:  Labor Eval  Seen at 1030    HPI: Danielle Bridges is a 46 y.o. [id number] at 73w6dwho presents to maternity admissions reporting Leaking of fluid since this morning, ? time.  States it was Small gushes She reports good fetal movement, denies LOF, vaginal bleeding, vaginal itching/burning, urinary symptoms, h/a, dizziness, n/v, or fever/chills.    Vaginal Discharge  The patient's primary symptoms include pelvic pain, vaginal bleeding and vaginal discharge. The patient's pertinent negatives include no genital itching, genital lesions or genital odor. This is a new problem. The current episode started today. The problem occurs constantly. The problem has been unchanged. The pain is moderate. She is pregnant. Associated symptoms include abdominal pain. Pertinent negatives include no fever, nausea or vomiting. The vaginal discharge was clear and watery. The vaginal bleeding is lighter than menses. She has not been passing clots. She has not been passing tissue.     Past Medical History: Past Medical History:  Diagnosis Date  . Chlamydia infection affecting pregnancy     Past obstetric history: OB History  Gravida Para Term Preterm AB Living  5 3 3   1 3   SAB TAB Ectopic Multiple Live Births  1       3    # Outcome Date GA Lbr Len/2nd Weight Sex Delivery Anes PTL Lv  5 Current           4 Term 10/29/07 [redacted]w[redacted]d   F Vag-Spont None N LIV  3 Term 06/2003 [redacted]w[redacted]d   F Vag-Spont None N LIV  2 Term 04/14/02 [redacted]w[redacted]d   F Vag-Spont None N LIV  1 SAB               Past Surgical History: Past Surgical History:  Procedure Laterality Date  . INCISION AND DRAINAGE    . OTHER SURGICAL HISTORY     femal circumcision  . THYROIDECTOMY N/A    Patient has not had thyroid removed  . TONSILLECTOMY      Family History: Family History  Problem Relation Age of Onset  . Hypertension Mother     Social History: Social History  Substance Use Topics  . Smoking status: Never Smoker  . Smokeless  tobacco: Never Used  . Alcohol use No    Allergies: No Known Allergies  Meds:  Prescriptions Prior to Admission  Medication Sig Dispense Refill Last Dose  . Prenat w/o A Vit-FeFum-FePo-FA (CONCEPT OB) 130-92.4-1 MG CAPS Take 1 capsule by mouth daily. 30 capsule 11 Past Week at Unknown time    ROS:  Review of Systems  Constitutional: Negative for fever.  Gastrointestinal: Positive for abdominal pain. Negative for nausea and vomiting.  Genitourinary: Positive for pelvic pain and vaginal discharge.     I have reviewed patient's Past Medical Hx, Surgical Hx, Family Hx, Social Hx, medications and allergies.   Physical Exam  Patient Vitals for the past 24 hrs:  BP Temp Pulse Resp  09/20/17 1040 (!) 116/51 (!) 97.5 F (36.4 C) 71 20  09/20/17 1039 - - 65 -   Constitutional: Well-developed, well-nourished female in no acute distress.  Cardiovascular: normal rate and rhythm Respiratory: normal effort, no distress GI: Abd soft, non-tender, gravid appropriate for gestational age.  MS: Extremities nontender, no edema, normal ROM Neurologic: Alert and oriented x 4.  GU: Neg CVAT.  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 A/Positive/-- (05/23 1541)  Imaging:  [place] Mfm Ob Follow Up  Result Date: 08/31/2017 ----------------------------------------------------------------------  OBSTETRICS REPORT                      (Signed Final 08/31/2017 02:34 pm) ---------------------------------------------------------------------- Patient Info  ID #:       [id number]                          D.O.B.:  1971/07/03 (46 yrs)  Name:        Danielle Bridges                     Visit Date: 08/31/2017 01:33 pm ---------------------------------------------------------------------- Performed By  Performed By:     Lenise Arena        Ref. Address:     [address]                    RDMS                                                             [place]                                                             Jacky Kindle                                                             [phone]  Attending:        Particia Nearing MD       Location:         [facility]  Referred By:      Aviva Signs CNM ---------------------------------------------------------------------- Orders   #  Description                                 Code   1  [place] MFM OB FOLLOW UP                         [phone]  ----------------------------------------------------------------------   #  Ordered By               Order #        Accession #    Episode #   1  Scheryl Darter             [id number]      [id number]     [id number]  ---------------------------------------------------------------------- Indications   [redacted] weeks gestation of  pregnancy                Z3A.46   Advanced maternal age multigravida 52,         O3.522   second trimester (declined screening)   Encounter for other antenatal screening        Z36.2   follow-up  ---------------------------------------------------------------------- OB History  Blood Type:            Height:  5'3"   Weight (lb):  145       BMI:  25.68  Gravidity:    5         Term:   3        Prem:   0        SAB:   1  TOP:          0       Ectopic:  0        Living: 3 ---------------------------------------------------------------------- Fetal Evaluation  Num Of Fetuses:     1  Fetal Heart         164  Rate(bpm):  Cardiac Activity:   Observed  Presentation:       Cephalic  Placenta:           Posterior, above cervical os  P. Cord Insertion:  Previously Visualized  Amniotic Fluid  AFI FV:      Subjectively within normal limits  AFI  Sum(cm)     %Tile       Largest Pocket(cm)  11.34           30          5.15  RUQ(cm)       RLQ(cm)       LUQ(cm)        LLQ(cm)  1.88          2.02          2.29           5.15 ---------------------------------------------------------------------- Biometry  BPD:      87.1  mm     G. Age:  35w 1d         57  %    CI:        75.82   %    70 - 86                                                          FL/HC:      20.8   %    20.1 - 22.3  HC:      317.1  mm     G. Age:  35w 4d         32  %    HC/AC:      1.02        0.93 - 1.11  AC:      310.7  mm     G. Age:  35w 0d         57  %    FL/BPD:     75.7   %    71 - 87  FL:       65.9  mm     G. Age:  34w 0d         18  %    FL/AC:  21.2   %    20 - 24  HUM:      57.1  mm     G. Age:  33w 1d         29  %  Est. FW:    2519  gm      5 lb 9 oz     57  % ---------------------------------------------------------------------- Gestational Age  LMP:           35w 0d        Date:  12/29/16                 EDD:   10/05/17  U/S Today:     35w 0d                                        EDD:   10/05/17  Best:          35w 0d     Det. By:  LMP  (12/29/16)          EDD:   10/05/17 ---------------------------------------------------------------------- Anatomy  Cranium:               Appears normal         Aortic Arch:            Previously seen  Cavum:                 Appears normal         Ductal Arch:            Previously seen  Ventricles:            Appears normal         Diaphragm:              Previously seen  Choroid Plexus:        Previously seen        Stomach:                Appears normal, left                                                                        sided  Cerebellum:            Previously seen        Abdomen:                Appears normal  Posterior Fossa:       Previously seen        Abdominal Wall:         Previously seen  Nuchal Fold:           Previously seen        Cord Vessels:           Previously seen  Face:                  Orbits and profile      Kidneys:                Appear normal  previously seen  Lips:                  Previously seen        Bladder:                Appears normal  Thoracic:              Appears normal         Spine:                  Previously seen  Heart:                 Appears normal         Upper Extremities:      Previously seen                         (4CH, axis, and situs  RVOT:                  Previously seen        Lower Extremities:      Previously seen  LVOT:                  Previously seen  Other:  Female gender. Heels visualized. 5th digits previously visualized. ---------------------------------------------------------------------- Cervix Uterus Adnexa  Cervix  Not visualized (advanced GA >29wks) ---------------------------------------------------------------------- Impression  SIUP at 35+0 weeks  Cephalic presentation  Normal interval anatomy; anatomic survey complete  Normal amniotic fluid volume  Appropriate interval growth with EFW at the 57th %tile ---------------------------------------------------------------------- Recommendations  Antenatal testing ----------------------------------------------------------------------                 Particia Nearing, MD Electronically Signed Final Report   08/31/2017 02:34 pm ----------------------------------------------------------------------  US Fetal Bpp W/nonstress  Result Date: 09/13/2017 ----------------------------------------------------------------------  OBSTETRICS REPORT                      (Signed Final 09/13/2017 10:07 am) ---------------------------------------------------------------------- Patient Info  ID #:       [id number]                          D.O.B.:  Apr 22, 1971 (46 yrs)  Name:       Danielle Bridges                     Visit Date: 09/11/2017 02:38 pm ---------------------------------------------------------------------- Performed By  Performed By:     Sedalia Muta [facility]          Ref. Address:     [address]                                                              [address]                                                             [place]  [id number]  Attending:        Elsie Lincoln MD       [facility]         [facility]                                                             [facility]  Referred By:      Aviva Signs CNM ---------------------------------------------------------------------- Orders   #  Description                                 Code   1  US FETAL BPP W/NONSTRESS                    [phone]  ----------------------------------------------------------------------   #  Ordered By               Order #        Accession #    Episode #   1  Elsie Lincoln            [id number]      [id number]     [id number]  ---------------------------------------------------------------------- Service(s) Provided   US Fetal BPP W NST                                   [id number]  ---------------------------------------------------------------------- Indications   [redacted] weeks gestation of pregnancy                Z3A.29   Advanced maternal age multigravida 35+,        O32.523   third trimester  ---------------------------------------------------------------------- OB History  Blood Type:            Height:  5'3"   Weight (lb):  145       BMI:  25.68  Gravidity:    5         Term:   3        Prem:   0        SAB:   1  TOP:          0       Ectopic:  0        Living: 3 ---------------------------------------------------------------------- Fetal Evaluation  Num Of Fetuses:     1  Preg. Location:     Intrauterine  Cardiac Activity:   Observed  Presentation:       Cephalic  Amniotic Fluid  AFI FV:      Subjectively within normal limits  AFI Sum(cm)     %Tile       Largest Pocket(cm)  8.26            10  2.46  RUQ(cm)       RLQ(cm)       LUQ(cm)        LLQ(cm)  1.9           1.67          2.46            2.23 ---------------------------------------------------------------------- Biophysical Evaluation  Amniotic F.V:   Pocket => 2 cm two         F. Tone:        Observed                  planes  F. Movement:    Observed                   N.S.T:          Reactive  F. Breathing:   Observed                   Score:          10/10 ---------------------------------------------------------------------- Gestational Age  LMP:           36w 4d        Date:  12/29/16                 EDD:   10/05/17  Best:          36w 4d     Det. By:  LMP  (12/29/16)          EDD:   10/05/17 ---------------------------------------------------------------------- Impression  Normal amniotic fluid volume. BPP 10/10. Cephalic  presentation. ---------------------------------------------------------------------- Recommendations  Continue antenatal testing. ----------------------------------------------------------------------               Jaynie Collins, MD Electronically Signed Final Report   09/13/2017 10:07 am ----------------------------------------------------------------------  US Fetal Bpp W/nonstress  Result Date: 09/11/2017 ----------------------------------------------------------------------  OBSTETRICS REPORT                      (Signed Final 09/11/2017 10:49 am) ---------------------------------------------------------------------- Patient Info  ID #:       [id number]                          D.O.B.:  Aug 09, 1971 (46 yrs)  Name:       Danielle Bridges                     Visit Date: 09/07/2017 06:23 pm ---------------------------------------------------------------------- Performed By  Performed By:     Sedalia [facility]          Ref. Address:     [address]                                                             [address]                                                             [place]  [id number]  Attending:        Nicholaus Bloom MD           Location[facility]         [facility]                                                              [facility]  Referred By:      Aviva Signs CNM ---------------------------------------------------------------------- Orders   #  Description                                 Code   1  US FETAL BPP W/NONSTRESS                    [phone]  ----------------------------------------------------------------------   #  Ordered By               Order #        Accession #    Episode #   1  Candelaria Celeste            [id number]      [id number]     [id number]  ---------------------------------------------------------------------- Service(s) Provided   US Fetal BPP W NST                                   [phone]  ---------------------------------------------------------------------- Indications   [redacted] weeks gestation of pregnancy                Z3A.68   Advanced maternal age multigravida 42+,        O24.523   third trimester  ---------------------------------------------------------------------- OB History  Blood Type:            Height:  5'3"   Weight (lb):  145       BMI:  25.68  Gravidity:    5         Term:   3        Prem:   0        SAB:   1  TOP:          0       Ectopic:  0        Living: 3 ---------------------------------------------------------------------- Fetal Evaluation  Num Of Fetuses:     1  Preg. Location:     Intrauterine  Cardiac Activity:   Observed  Presentation:       Cephalic  Amniotic Fluid  AFI FV:      Subjectively low-normal  AFI Sum(cm)     %Tile       Largest Pocket(cm)  6.33            < 3  3.24  RUQ(cm)       RLQ(cm)       LUQ(cm)        LLQ(cm)  3.24          0             1.5            1.59 ---------------------------------------------------------------------- Biophysical Evaluation  Amniotic F.V:   Decreased                  F. Tone:        Observed  F. Movement:    Observed                   N.S.T:          Reactive  F. Breathing:   Not Observed                Score:          8/10 ---------------------------------------------------------------------- Gestational Age  LMP:           36w 0d        Date:  12/29/16                 EDD:   10/05/17  Best:          Stevie Kern36w 0d     Det. By:  LMP  (12/29/16)          EDD:   10/05/17 ---------------------------------------------------------------------- Impression  BPP 8/10 ---------------------------------------------------------------------- Recommendations  Continue weekly BPP ----------------------------------------------------------------------                   Nicholaus BloomMyra Dove, MD Electronically Signed Final Report   09/11/2017 10:49 am ----------------------------------------------------------------------   MAU Course/MDM: I have Done a speculum exam and fern test which was Positive   Pooling was positive Nitrazine not done.     Assessment: 1. Elderly multigravida in third trimester   2. Family history of consanguinity   3. Chlamydia infection affecting pregnancy, antepartum   4.      PROM at term 5.     Latent phase labor  Plan: Admit to Birthing suites Routine orders    Wynelle BourgeoisMarie Khaniya Tenaglia CNM, MSN Certified Nurse-Midwife 09/20/2017 10:55 AM

## 2017-09-21 LAB — RPR: RPR Ser Ql: NONREACTIVE

## 2017-09-21 NOTE — Progress Notes (Signed)
CSW received consult due to score greater than 9, or positive for SI on Edinburgh Depression Screen (MOB scored a 15).  CSW utilized Dexter (interpreter Asmaa [phone]) to assist with language barrier.  When CSW arrived, MOB was sitting on the bed, infant was asleep in bassinet, daughter (Razad) was sitting in recliner, and FOB (Nazar) was sitting on the couch.  CSW explained CSW's role, and gave CSW permission to meet with MOB while MOB's family was present.   CSW inquired about MOB's thoughts and feelings about being a new mother again.  MOB expressed that MOB was happy but was having trouble with breastfeeding (Lactation had been consulted).  CSW validated MOB's thoughts and feelings and normalized MOB's feelings of frustration regarding breastfeeding.  CSW asked about MOB's responses to EDPS and MOB appeared confused.  MOB reported that MOB has happy and relaxed and may have misunderstood questions on the EDPS. CSW assessed for safety and MOB denied SI and HI.  CSW provided education regarding Baby Blues vs PMADs and provided MOB with information [facility].  CSW encouraged MOB to evaluate her mental health throughout the postpartum period with the use of the New Mom Checklist developed by [facility]partum [facility] and notify a medical professional if symptoms arise.    MOB has supports and reports feeling prepared to parent.   There are no barriers to d/c.  Blaine HamperAngel Boyd-Gilyard, MSW, LCSW Clinical Social Work [phone]

## 2017-09-21 NOTE — Anesthesia Postprocedure Evaluation (Addendum)
Anesthesia Post Note  Patient: Danielle Bridges  Procedure(s) Performed: AN AD HOC LABOR EPIDURAL     Patient location during evaluation: Mother Baby Anesthesia Type: Epidural Level of consciousness: awake Pain management: pain level controlled Vital Signs Assessment: post-procedure vital signs reviewed and stable Respiratory status: spontaneous breathing Cardiovascular status: stable Postop Assessment: no headache, epidural receding and patient able to bend at knees Anesthetic complications: no Comments: Daughter translated per pt consent;pt declined option of interpreter    Last Vitals:  Vitals:   09/20/17 2100 09/21/17 0115  BP: (!) 95/46 (!) 111/53  Pulse: 77 72  Resp: 18 17  Temp: 36.8 C 36.7 C  SpO2: 100% 99%    Last Pain:  Vitals:   09/21/17 0730  TempSrc:   PainSc: 0-No pain   Pain Goal: Patients Stated Pain Goal: 10 (09/20/17 1443)               Edison PaceWILKERSON,Nikita Humble

## 2017-09-21 NOTE — Progress Notes (Signed)
POSTPARTUM PROGRESS NOTE  Post Partum Day 1  Subjective:  Melody HaverHala Kluge is a 46 y.o. [id number] s/p SOL at 6556w6d.  No acute events overnight.  Pt denies problems with ambulating, voiding or po intake.  She denies nausea or vomiting.  Pain is well controlled.  She has had flatus. She has not had bowel movement.  Lochia Small.   Objective: Blood pressure 118/62, pulse 78, temperature 98 F (36.7 C), temperature source Oral, resp. rate 18, height 5\' 3"  (1.6 m), weight 154 lb (69.9 kg), last menstrual period 12/29/2016, SpO2 99 %, unknown if currently breastfeeding.  Physical Exam:  General: alert, cooperative and no distress Chest: no respiratory distress Heart:regular rate, distal pulses intact Abdomen: soft, nontender,  Uterine Fundus: firm, appropriately tender DVT Evaluation: No calf swelling or tenderness Extremities: no edema Skin: warm, dry   Recent Labs  09/20/17 1121  HGB 14.9  HCT 44.2    Assessment/Plan: Jaydon Kossman is a 46 y.o. [id number] s/p SOL at 4456w6d   PPD# 1 - Doing well Contraception:  POPs Feeding: Breast and bottle Dispo: Plan for discharge tomorrow.   LOS: 1 day   Lynnae PrudeKeriann S MinottMD 09/21/2017, 8:55 AM

## 2017-09-21 NOTE — Lactation Note (Signed)
This note was copied from a baby's chart. Lactation Consultation Note  Patient Name: Danielle Bridges ZOXWR'UToday's Date: 09/21/2017 Reason for consult: Follow-up assessment;Early term 37-38.6wks (2nd LC visit )  2nd LC visit LC checked baby's diaper, dry , and spoon fed .5 ml of EBM , baby tolerated Well. LC assisted on the right breast 1st , areola to edematous, and semi compressible to large for  The baby's mouth, and mom having discomfort.   LC placed baby STS on left breast / side lying and assisted mom to latch, after breast  Massage, hand express, reverse pressure due to areola edema. Areola more compressible  And baby latched after several attempts and fed for 30 mins with swallows, mom more comfortable.   LC plan -  Unable to use shells no bra  Breast feed 1st breast for 15 -20 mins , 30 mins max  And supplement after wards ,  Post pump 15 -20 mins , save milk for next feedings.   LC reviewed LC plan with the RN Kristine LineaKaren Kane caring for mom.    Maternal Data Has patient been taught Hand Expression?: Yes Does the patient have breastfeeding experience prior to this delivery?: Yes  Feeding Feeding Type: Breast Fed Length of feed:  (still feeding at 12 mins, multiple swallows , initially uncomfortable , improved )  LATCH Score Latch: Repeated attempts needed to sustain latch, nipple held in mouth throughout feeding, stimulation needed to elicit sucking reflex.  Audible Swallowing: A few with stimulation (increased )  Type of Nipple: Everted at rest and after stimulation (areola edema , used massage , reverswe pressure )  Comfort (Breast/Nipple): Soft / non-tender  Hold (Positioning): Assistance needed to correctly position infant at breast and maintain latch.  LATCH Score: 7  Interventions Interventions: Breast feeding basics reviewed;Assisted with latch;Skin to skin;Breast massage;Hand express;Breast compression;Adjust position;Support pillows;Position options;DEBP (mom  doesn't have a bra , so unable to waer the shells for areola edema )  Lactation Tools Discussed/Used Tools: Pump Breast pump type: Double-Electric Breast Pump WIC Program: Yes   Consult Status Consult Status: Follow-up Date: 09/22/17 Follow-up type: In-patient    Danielle SprangMargaret Ann [name] 09/21/2017, 3:22 PM

## 2017-09-21 NOTE — Lactation Note (Signed)
This note was copied from a baby's chart. Lactation Consultation Note  Patient Name: Danielle Bridges [id number]'WToday's Date: 09/21/2017 Reason for consult: Initial assessment;Other (Comment);Early term 137-38.6wks ([employer] interpreter - Asmaa [phone] present via Skype , ET , Lactation induction )  1st LC visit with this mom, baby , and daughter , and dad. Mom gave permission to speak in front of them.  LC praised mom for how the baby has been feeding already , via doc flow sheets,also updated the doc flow sheets.  Per mom active with WIC / [place] , and will need a [facility].  Mom is breast / formula and due to the baby being a Early term infant and close to 6 pounds, LC recommended feeding on the 1st breast 15 -20 mins , 30 mins max And supplementing after wards, and then post pumping for 15 - 20 mins.  LC set up the DEBP and watched mom pump the #24 Flange was to snug , increased to the #27 and more comfortable ( .5 ml expressed )  LC did not have mom pump only a few minutes / baby due to feed ( 3 hours )  LC stepped out for 10 mins due to the social worker needing to talk to mom with interpreter available.     Maternal Data Does the patient have breastfeeding experience prior to this delivery?: Yes  Feeding Feeding Type: Breast Fed Length of feed: 20 min  LATCH Score Latch: Grasps breast easily, tongue down, lips flanged, rhythmical sucking.  Audible Swallowing: Spontaneous and intermittent  Type of Nipple: Everted at rest and after stimulation  Comfort (Breast/Nipple): Soft / non-tender  Hold (Positioning): No assistance needed to correctly position infant at breast.  LATCH Score: 10  Interventions Interventions: Breast feeding basics reviewed  Lactation Tools Discussed/Used WIC Program: Yes   Consult Status Date: 09/21/17 Follow-up type: In-patient    Matilde SprangMargaret Ann Shonna Deiter 09/21/2017, 2:13 PM

## 2017-09-22 MED ORDER — SENNOSIDES-DOCUSATE SODIUM 8.6-50 MG PO TABS: 2.0000 | ORAL_TABLET | ORAL | 0 refills | Status: DC

## 2017-09-22 MED ORDER — IBUPROFEN 600 MG PO TABS: 600.0000 mg | ORAL_TABLET | Freq: Four times a day (QID) | ORAL | 1 refills | Status: DC | PRN

## 2017-09-22 NOTE — Progress Notes (Signed)
Discharge instructions reviewed with patient using the interpreter services, ID [phone].  Pt acknowledged instructions.

## 2017-09-22 NOTE — Discharge Summary (Signed)
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 yo [id number] at [redacted]w[redacted]d was admitted in Latent Labor on 09/20/2017. Patient had an uncomplicated labor course as follows:  Membrane Rupture Time/Date: 8:00 AM ,09/20/2017   Intrapartum Procedures: Episiotomy: None [1]                                         Lacerations:  None [1]  Patient had a delivery of a Viable infant. 09/20/2017  Information for the patient's newborn:  Adaijah, Endres [[id number]]       Pateint had an uncomplicated postpartum course.  She is ambulating, tolerating a regular diet, passing flatus, and urinating well. Patient is discharged home in stable condition on 09/22/17.   Physical exam  Vitals:   09/21/17 0115 09/21/17 0730 09/21/17 1827 09/22/17 0540  BP: (!) 111/53 118/62 (!) 101/59 113/63  Pulse: 72 78 74 77  Resp: 17 18 16 18   Temp: 98.1 F (36.7 C) 98 F (36.7 C) 98.6 F (37 C) 98.7 F (37.1 C)  TempSrc: Oral Oral Oral Oral  SpO2: 99%  100%   Weight:      Height:       General: alert, cooperative and no distress Lochia: appropriate Uterine Fundus: firm Incision: N/A DVT Evaluation: No evidence of DVT seen on physical exam. No cords or calf tenderness. Labs: Lab  Results  Component Value Date   WBC 10.9 (H) 09/20/2017   HGB 14.9 09/20/2017   HCT 44.2 09/20/2017   MCV 89.1 09/20/2017   PLT 208 09/20/2017   CMP Latest Ref Rng & Units 02/22/2017  Glucose 65 - 99 mg/dL 83  BUN 6 - 24 mg/dL 5(L)  Creatinine 1.30 - 1.00 mg/dL 8.65  Sodium 784 - 696 mmol/L 139  Potassium 3.5 - 5.2 mmol/L 4.2  Chloride 96 - 106 mmol/L 105  CO2 18 - 29 mmol/L 21  Calcium 8.7 - 10.2 mg/dL 9.2  Total Protein 6.0 - 8.5 g/dL 6.1  Total Bilirubin 0.0 - 1.2 mg/dL 0.2  Alkaline Phos 39 - 117 IU/L 50  AST 0 - 40 IU/L 11  ALT 0 - 32 IU/L 10    Discharge instruction: per After Visit Summary and "Baby and Me Booklet".  After visit meds:  Allergies as of 09/22/2017   No Known Allergies     Medication List    TAKE these medications   CONCEPT OB 130-92.4-1 MG Caps Take 1 capsule by mouth daily.   ibuprofen 600 MG tablet Commonly known as:  ADVIL,MOTRIN Take 1 tablet (600 mg total) by mouth every 6 (six) hours as needed.   senna-docusate 8.6-50 MG tablet Commonly known as:  Senokot-S Take 2 tablets by mouth daily.       Diet: routine diet  Activity: Advance as tolerated. Pelvic rest for 6 weeks.   Outpatient follow up:4 weeks Follow up Appt:No future appointments. Follow up Visit:No Follow-up on file.  Postpartum contraception: Progesterone only pills  Newborn Data: Live born female  Birth Weight: 6 lb 4.5 oz (2850 g) APGAR: 8, 9  Newborn Delivery   Birth date/time:  09/20/2017 18:21:00 Delivery type:  Vaginal, Spontaneous Delivery      Baby Feeding: Breast Disposition:home with mother   09/22/2017 Arlyce Harmanimothy Nohemi Nicklaus, DO

## 2017-09-22 NOTE — Discharge Instructions (Signed)

## 2017-09-25 ENCOUNTER — Encounter: Payer: Medicaid Other | Admitting: Obstetrics and Gynecology

## 2017-09-25 ENCOUNTER — Other Ambulatory Visit: Payer: Medicaid Other

## 2017-10-02 ENCOUNTER — Encounter: Payer: Medicaid Other | Admitting: Obstetrics and Gynecology

## 2017-10-02 ENCOUNTER — Other Ambulatory Visit: Payer: Medicaid Other

## 2017-10-09 ENCOUNTER — Encounter: Payer: Medicaid Other | Admitting: Obstetrics and Gynecology

## 2017-10-09 ENCOUNTER — Other Ambulatory Visit: Payer: Medicaid Other

## 2017-10-16 ENCOUNTER — Encounter: Payer: Medicaid Other | Admitting: Obstetrics and Gynecology

## 2017-10-16 ENCOUNTER — Other Ambulatory Visit: Payer: Medicaid Other

## 2017-11-06 ENCOUNTER — Ambulatory Visit (INDEPENDENT_AMBULATORY_CARE_PROVIDER_SITE_OTHER): Payer: Medicaid Other | Admitting: Medical

## 2017-11-06 ENCOUNTER — Encounter: Payer: Self-pay | Admitting: Medical

## 2017-11-06 DIAGNOSIS — Z3202 Encounter for pregnancy test, result negative: Secondary | ICD-10-CM | POA: Diagnosis not present

## 2017-11-06 LAB — POCT PREGNANCY, URINE: PREG TEST UR: NEGATIVE

## 2017-11-06 MED ORDER — NORETHINDRONE 0.35 MG PO TABS: 1.0000 | ORAL_TABLET | Freq: Every day | ORAL | 11 refills | Status: DC

## 2017-11-06 NOTE — Progress Notes (Signed)
Subjective:     Danielle Bridges is a 46 y.o. female who presents for a postpartum visit. She is 6 weeks postpartum following a spontaneous vaginal delivery. I have fully reviewed the prenatal and intrapartum course. The delivery was at 37 gestational weeks. Outcome: spontaneous vaginal delivery. Anesthesia: epidural. Postpartum course has been normal. Baby's course has been normal. Baby is feeding by both breast and bottle - Carn[employer] Good Start Soy. Bleeding no bleeding. Bowel function is normal. Bladder function is normal. Patient is not sexually active. Contraception method is abstinence. Postpartum depression screening: negative.  The following portions of the patient's history were reviewed and updated as appropriate: allergies, current medications, past family history, past medical history, past social history, past surgical history and problem list.  Review of Systems Pertinent items are noted in HPI.   Objective:    BP 100/66   Pulse 75   Ht 5' (1.524 m)   Wt 147 lb 4.8 oz (66.8 kg)   BMI 28.77 kg/m   General:  alert and cooperative   Breasts:  negative  Lungs: clear to auscultation bilaterally  Heart:  regular rate and rhythm, S1, S2 normal, no murmur, click, rub or gallop  Abdomen: soft, non-tender; bowel sounds normal; no masses,  no organomegaly   Vulva:  not evaluated  Vagina: normal vagina  Cervix:  not evaluated  Corpus: not examined  Adnexa:  not evaluated  Rectal Exam: Not performed.        Assessment:     Normal postpartum exam. Pap smear not done at today's visit.  Last pap smear was normal 03/2017 Birth control counseling   Plan:    1. Contraception: oral progesterone-only contraceptive 2. Follow up in: 1 year for annual exam or sooner as needed.    Marny LowensteinWenzel, Danielle N, PA-C 11/06/2017 9:13 AM

## 2017-11-06 NOTE — Patient Instructions (Signed)
Oral Contraception Information Oral contraceptive pills (OCPs) are medicines taken to prevent pregnancy. OCPs work by preventing the ovaries from releasing eggs. The hormones in OCPs also cause the cervical mucus to thicken, preventing the sperm from entering the uterus. The hormones also cause the uterine lining to become thin, not allowing a fertilized egg to attach to the inside of the uterus. OCPs are highly effective when taken exactly as prescribed. However, OCPs do not prevent sexually transmitted diseases (STDs). Safe sex practices, such as using condoms along with the pill, can help prevent STDs. Before taking the pill, you may have a physical exam and Pap test. Your health care provider may order blood tests. The health care provider will make sure you are a good candidate for oral contraception. Discuss with your health care provider the possible side effects of the OCP you may be prescribed. When starting an OCP, it can take 2 to 3 months for the body to adjust to the changes in hormone levels in your body. Types of oral contraception  The combination pill-This pill contains estrogen and progestin (synthetic progesterone) hormones. The combination pill comes in 21-day, 28-day, or 91-day packs. Some types of combination pills are meant to be taken continuously (365-day pills). With 21-day packs, you do not take pills for 7 days after the last pill. With 28-day packs, the pill is taken every day. The last 7 pills are without hormones. Certain types of pills have more than 21 hormone-containing pills. With 91-day packs, the first 84 pills contain both hormones, and the last 7 pills contain no hormones or contain estrogen only.  The minipill-This pill contains the progesterone hormone only. The pill is taken every day continuously. It is very important to take the pill at the same time each day. The minipill comes in packs of 28 pills. All 28 pills contain the hormone. Advantages of oral  contraceptive pills  Decreases premenstrual symptoms.  Treats menstrual period cramps.  Regulates the menstrual cycle.  Decreases a heavy menstrual flow.  May treatacne, depending on the type of pill.  Treats abnormal uterine bleeding.  Treats polycystic ovarian syndrome.  Treats endometriosis.  Can be used as emergency contraception. Things that can make oral contraceptive pills less effective OCPs can be less effective if:  You forget to take the pill at the same time every day.  You have a stomach or intestinal disease that lessens the absorption of the pill.  You take OCPs with other medicines that make OCPs less effective, such as antibiotics, certain HIV medicines, and some seizure medicines.  You take expired OCPs.  You forget to restart the pill on day 7, when using the packs of 21 pills.  Risks associated with oral contraceptive pills Oral contraceptive pills can sometimes cause side effects, such as:  Headache.  Nausea.  Breast tenderness.  Irregular bleeding or spotting.  Combination pills are also associated with a small increased risk of:  Blood clots.  Heart attack.  Stroke.  This information is not intended to replace advice given to you by your health care provider. Make sure you discuss any questions you have with your health care provider. Document Released: 01/28/2003 Document Revised: 04/14/2016 Document Reviewed: 04/28/2013 Elsevier Interactive Patient Education  2018 Elsevier Inc.  

## 2018-05-20 IMAGING — US US MFM OB FOLLOW-UP
1 series · 14 of 28 positions shown · non-contrast
Comparison: none

[Series 1: us mfm ob follow-up · 14 of 59 slices shown]
[im 3/59]
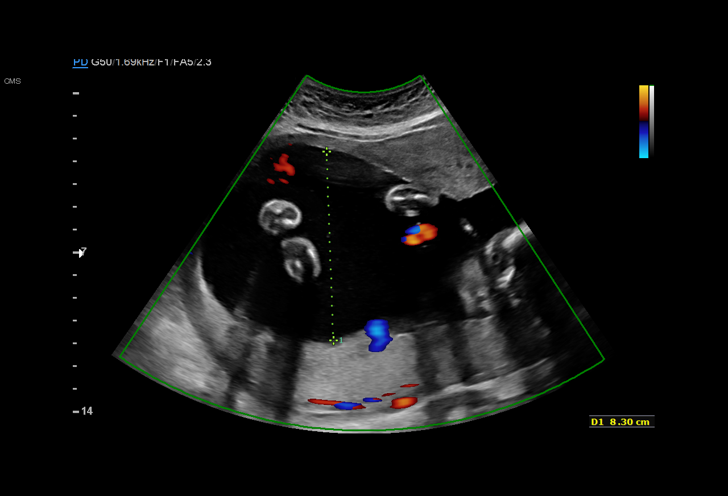
[im 7/59]
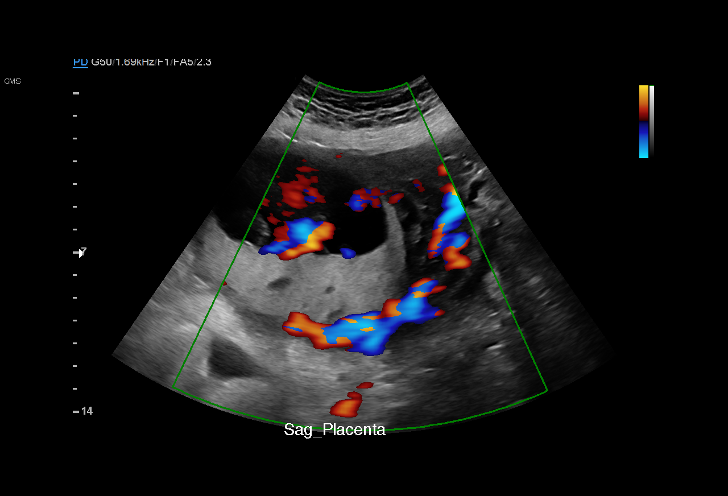
[im 11/59]
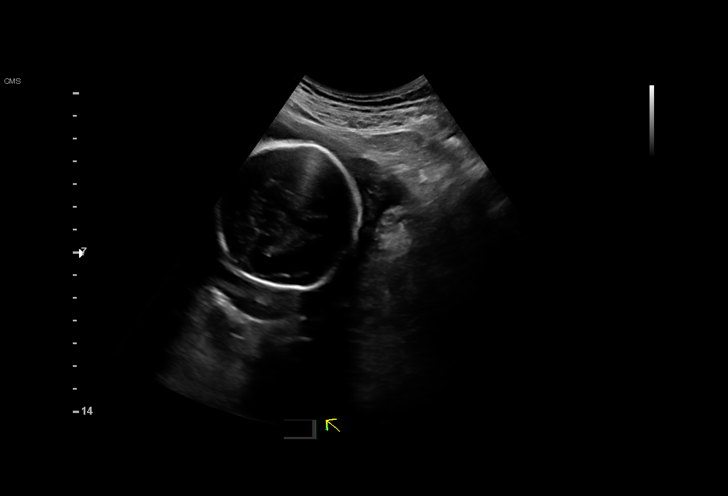
[im 16/59]
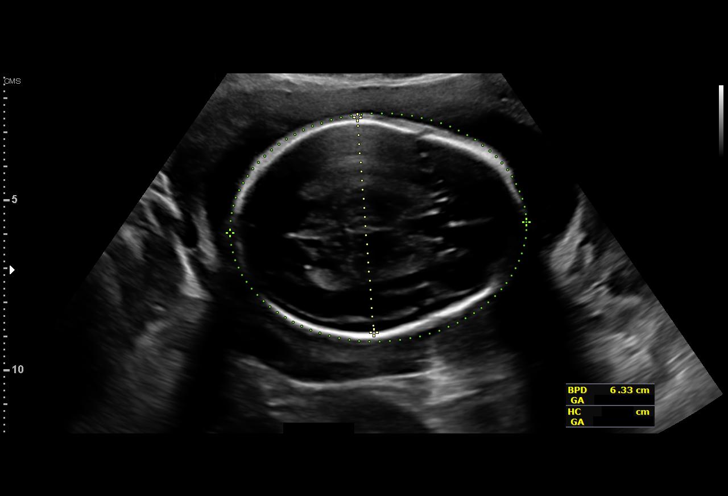
[im 20/59]
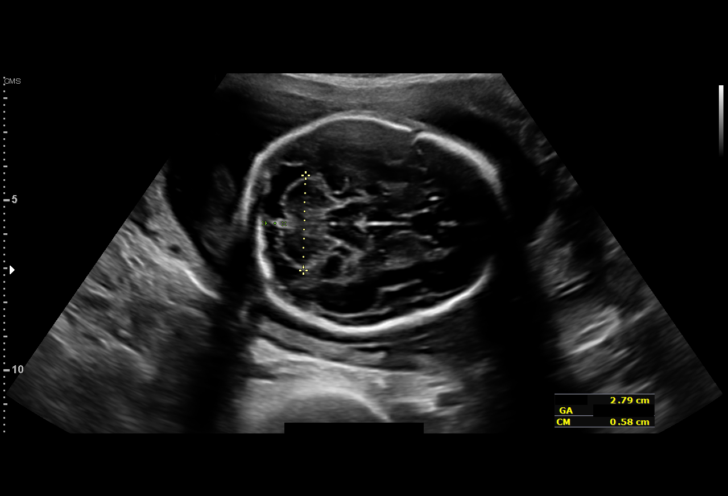
[im 24/59]
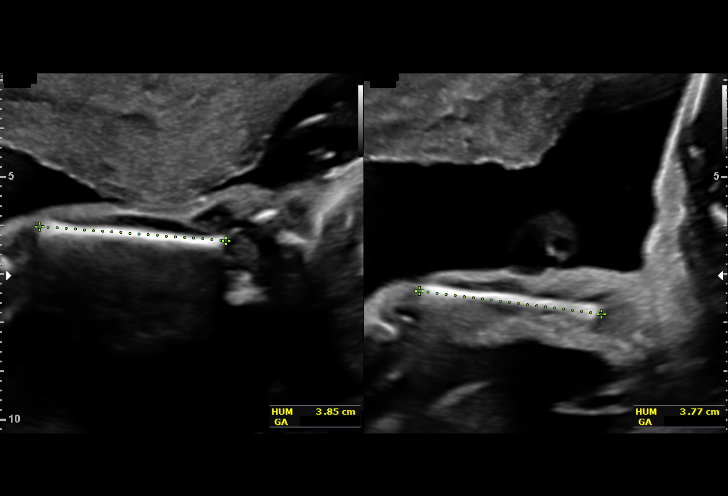
[im 28/59]
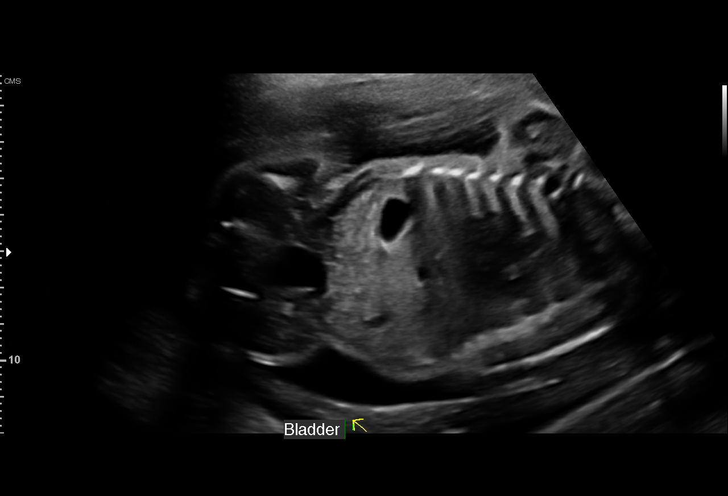
[im 33/59]
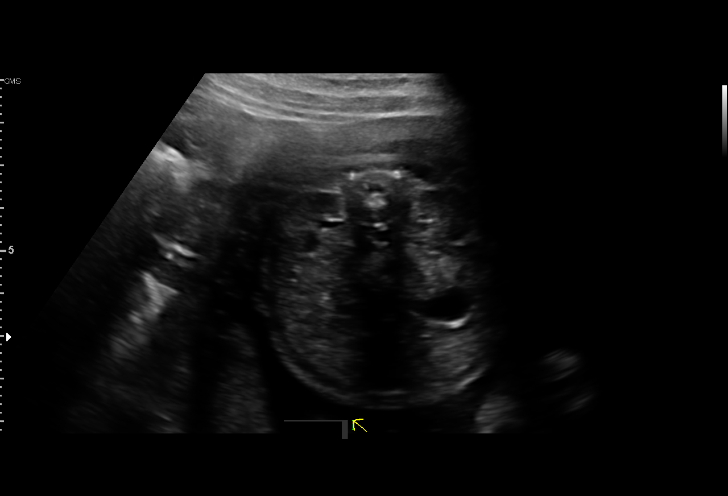
[im 37/59]
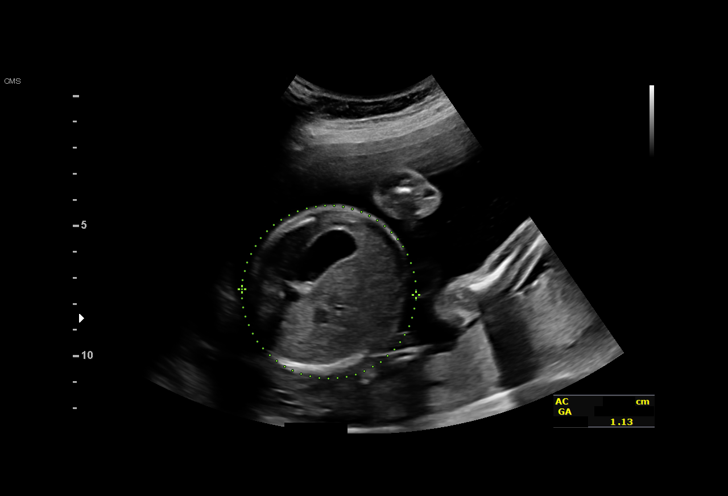
[im 41/59]
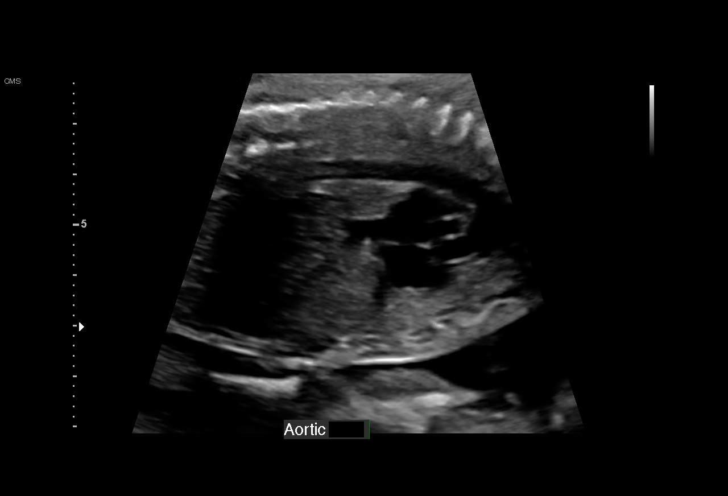
[im 46/59]
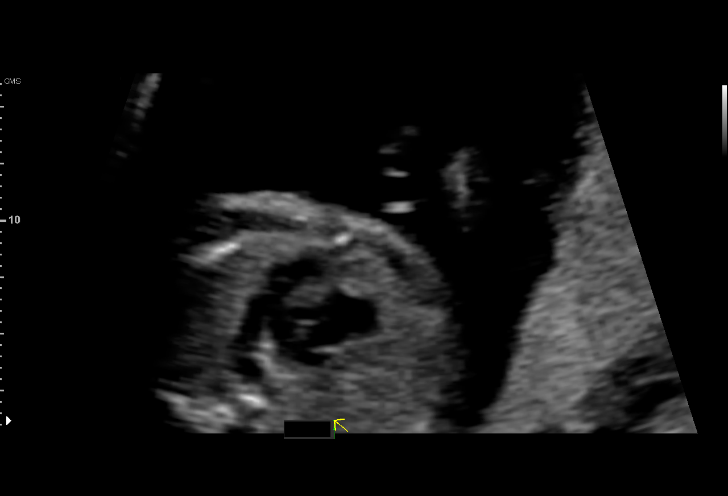
[im 50/59]
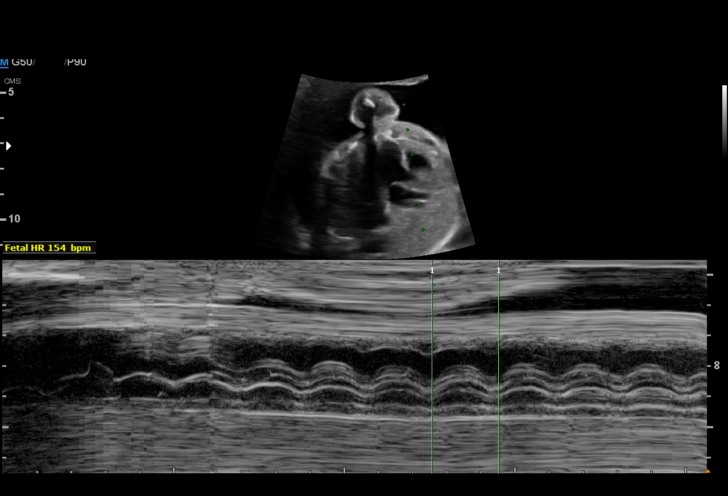
[im 54/59]
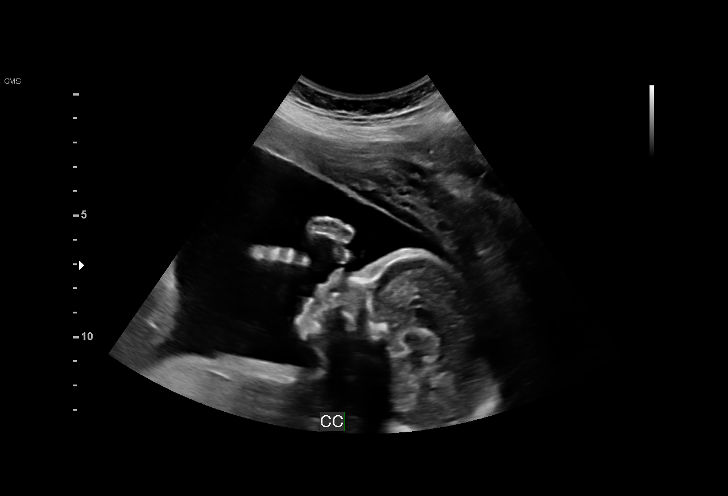
[im 59/59]
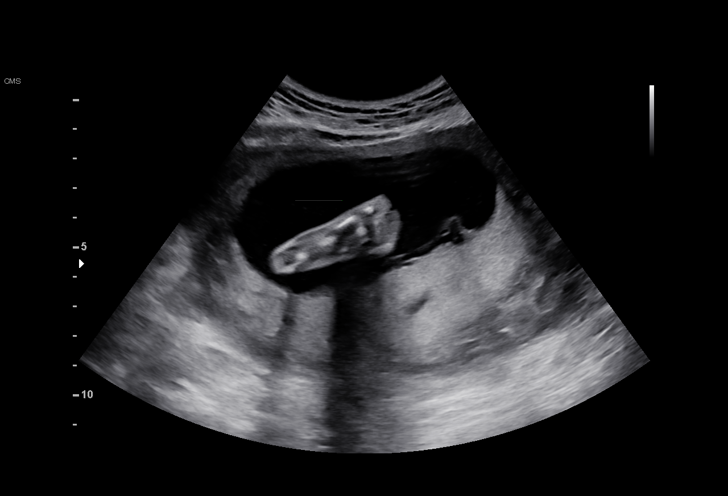

[14 of 28 positions shown; findings below may reference images not displayed]

[REDACTED]

1  BEDENASHVILI MAMAEDOVA          [id number]      [id number]     [id number]
Indications

23 weeks gestation of pregnancy
Advanced maternal age multigravida 46,
second trimester (declined screening)
OB History

Blood Type:            Height:  5'3"   Weight (lb):  145      BMI:
Gravidity:    5         Term:   3        Prem:   0        SAB:   1
TOP:          0       Ectopic:  0        Living: 3
Fetal Evaluation

Num Of Fetuses:     1
Fetal Heart         154
Rate(bpm):
Cardiac Activity:   Observed
Presentation:       Cephalic
Placenta:           Posterior, above cervical os
P. Cord Insertion:  Visualized

Amniotic Fluid
AFI FV:      Subjectively upper-normal

Largest Pocket(cm)
8.1
Biometry

BPD:      63.5  mm     G. Age:  25w 5d         94  %    CI:        70.75   %   70 - 86
FL/HC:      18.1   %   18.7 -
HC:      240.6  mm     G. Age:  26w 1d         96  %    HC/AC:      1.18       1.05 -
AC:      204.7  mm     G. Age:  25w 0d         77  %    FL/BPD:     68.5   %   71 - 87
FL:       43.5  mm     G. Age:  24w 2d         51  %    FL/AC:      21.3   %   20 - 24
HUM:      38.1  mm     G. Age:  23w 3d         32  %
CER:      27.9  mm     G. Age:  25w 0d         74  %
CM:        5.8  mm

Est. FW:     748  gm    1 lb 10 oz      72  %
Gestational Age

LMP:           23w 6d       Date:   12/29/16                 EDD:   10/05/17
U/S Today:     25w 2d                                        EDD:   09/25/17
Best:          23w 6d    Det. By:   LMP  (12/29/16)          EDD:   10/05/17
Anatomy

Cranium:               Appears normal         Aortic Arch:            Appears normal
Cavum:                 Appears normal         Ductal Arch:            Appears normal
Ventricles:            Appears normal         Diaphragm:              Previously seen
Choroid Plexus:        Appears normal         Stomach:                Appears normal, left
sided
Cerebellum:            Appears normal         Abdomen:                Appears normal
Posterior Fossa:       Previously seen        Abdominal Wall:         Appears nml (cord
insert, abd wall)
Nuchal Fold:           Appears normal         Cord Vessels:           Appears normal (3
vessel cord)
Face:                  Appears normal         Kidneys:                Appear normal
(orbits and profile)
Lips:                  Appears normal         Bladder:                Appears normal
Thoracic:              Appears normal         Spine:                  Previously seen
Heart:                 Appears normal         Upper Extremities:      Previously seen
(4CH, axis, and situs
RVOT:                  Appears normal         Lower Extremities:      Previously seen
LVOT:                  Appears normal

Other:  Female gender. Heels visualized. 5th digits previously visualized.
Cervix Uterus Adnexa

Cervix
Length:            4.1  cm.
Normal appearance by transabdominal scan.

Uterus
No abnormality visualized.

Left Ovary
Not visualized.

Right Ovary
Not visualized.

Cul De Sac:   No free fluid seen.

Adnexa:       No abnormality visualized.
Impression

SIUP at 23+6 weeks
Normal interval anatomy; anatomic survey complete
Normal amniotic fluid volume
Appropriate interval growth with EFW at the 72nd %tile
Recommendations

Follow-up ultrasound for growth in 
 6 weeks

## 2018-08-06 IMAGING — US US MFM OB FOLLOW-UP
1 series · 14 of 28 positions shown · non-contrast
Comparison: none

[Series 1: us mfm ob follow-up · 14 of 39 slices shown]
[im 2/39]
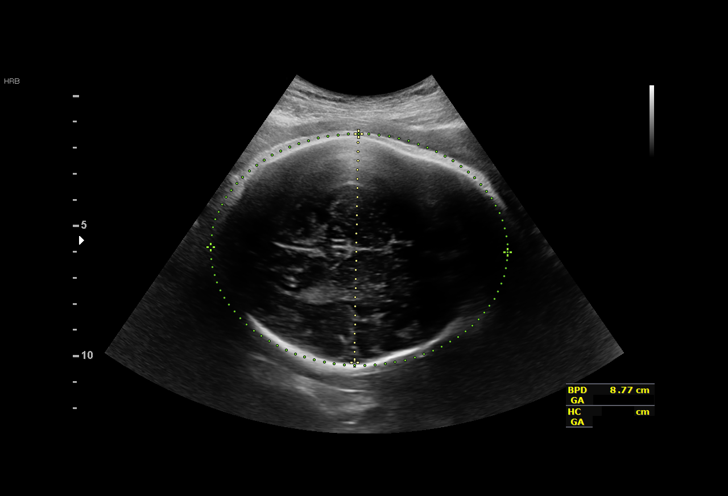
[im 5/39]
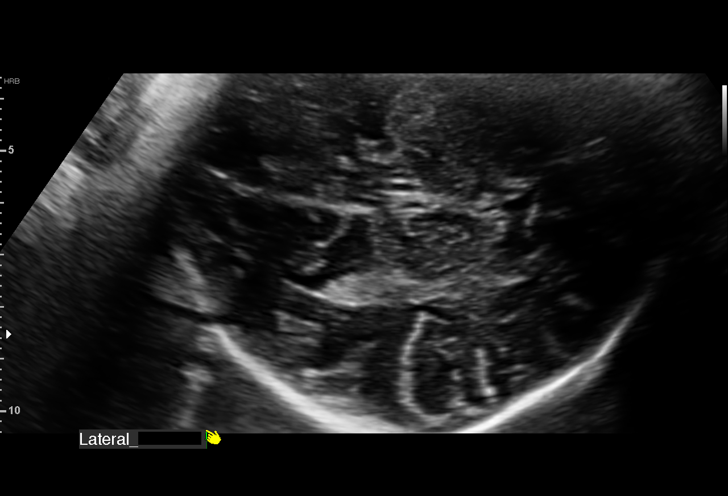
[im 8/39]
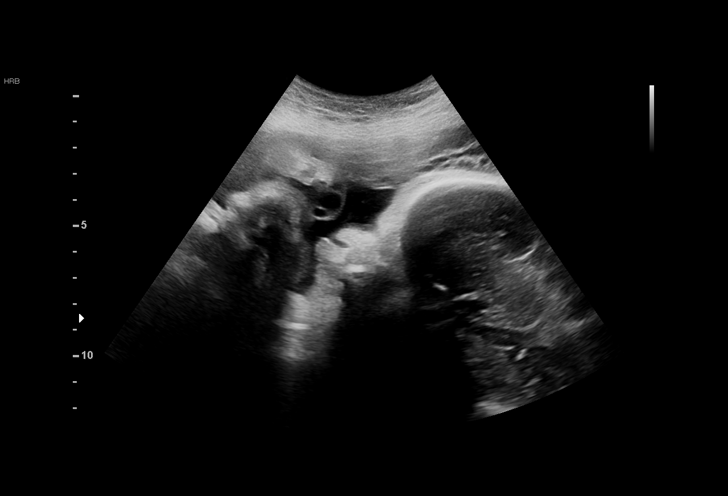
[im 10/39]
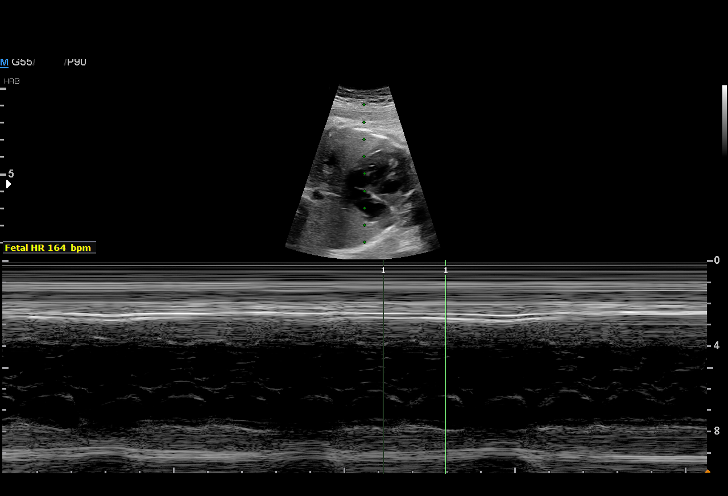
[im 13/39]
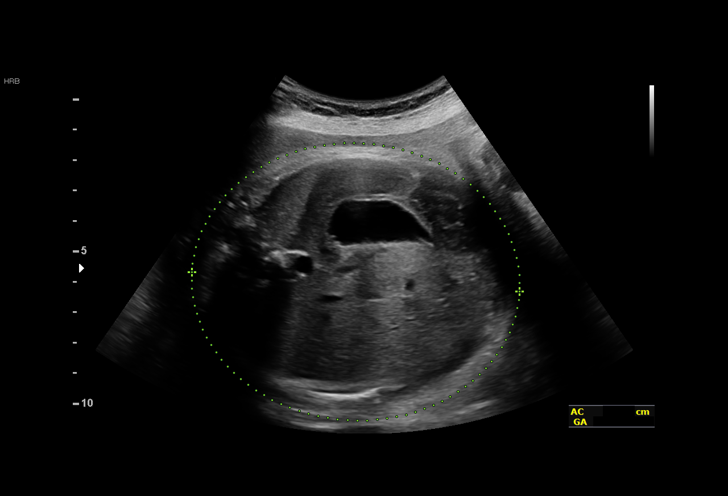
[im 16/39]
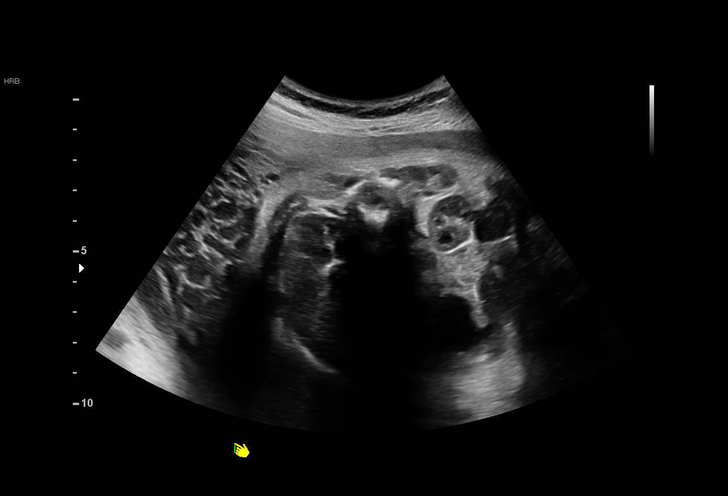
[im 19/39]
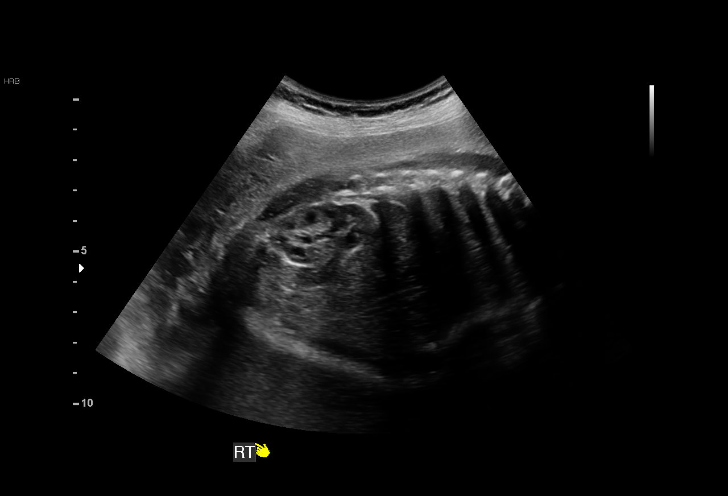
[im 22/39]
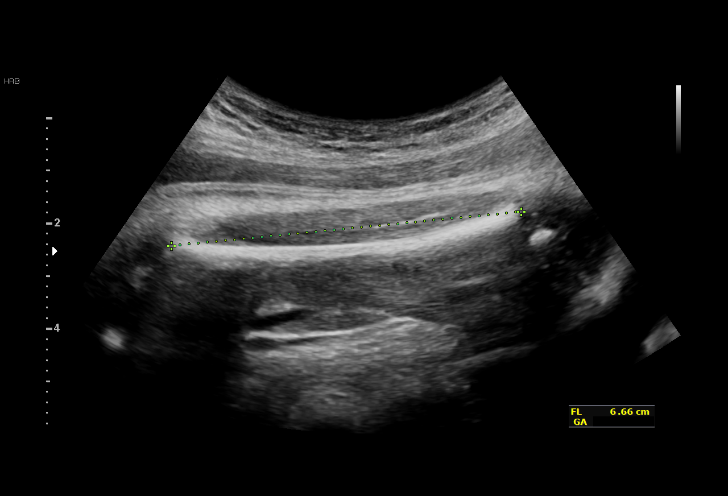
[im 24/39]
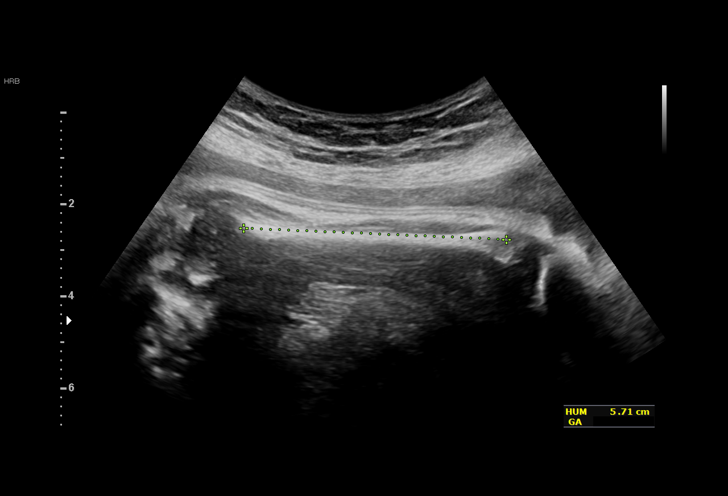
[im 27/39]
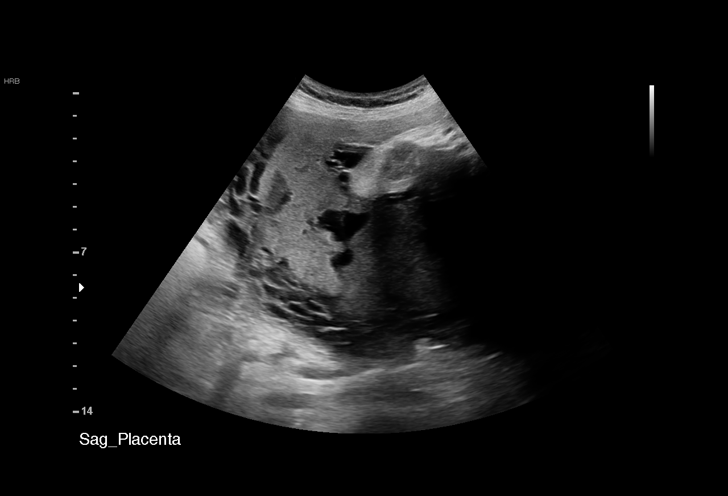
[im 30/39]
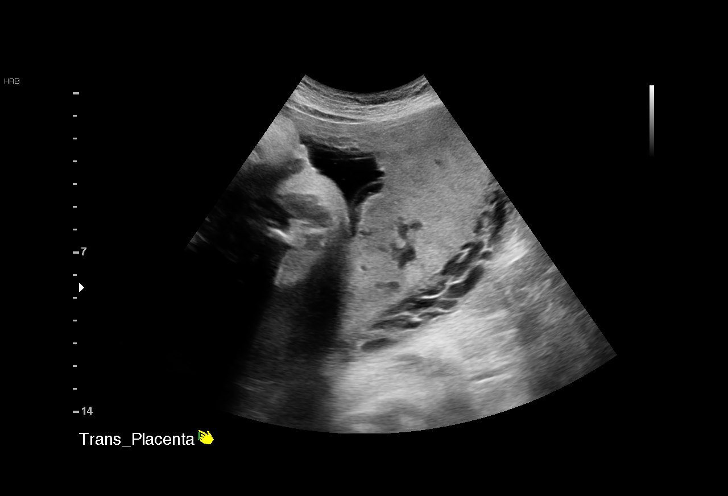
[im 33/39]
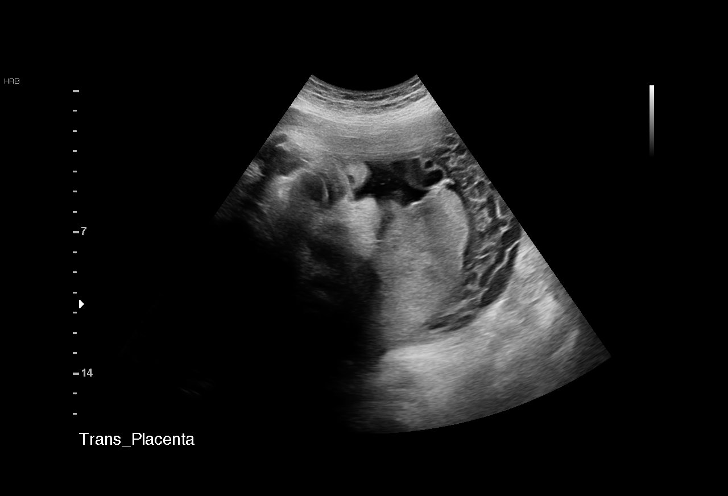
[im 36/39]
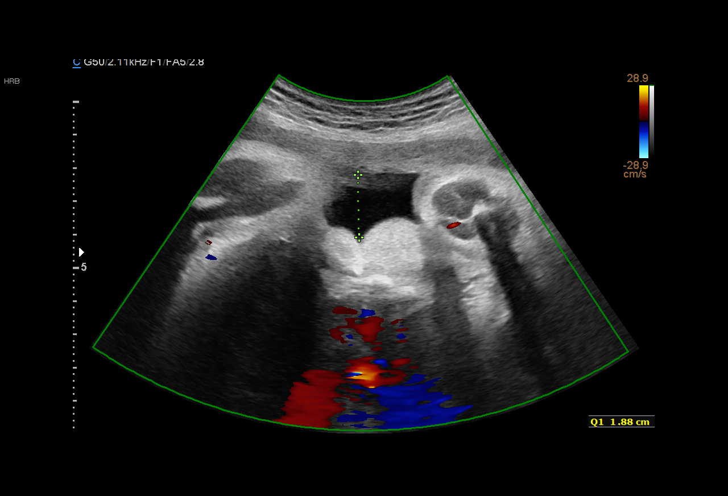
[im 39/39]
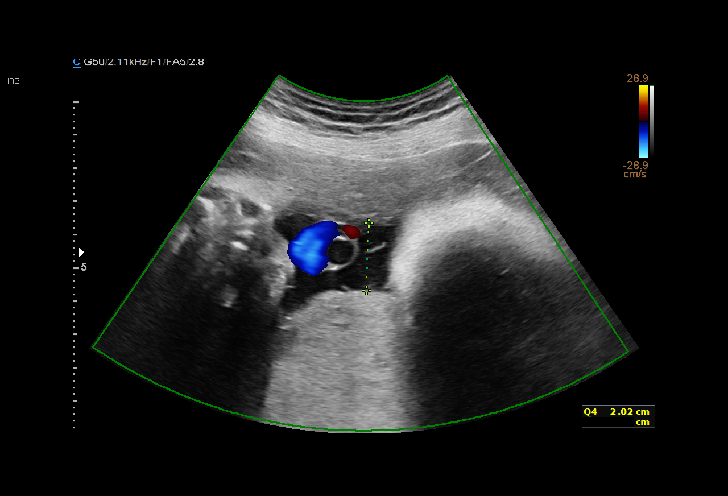

[14 of 28 positions shown; findings below may reference images not displayed]

[REDACTED]

1  LORRAINE JIM             [id number]      [id number]     [id number]
Indications

35 weeks gestation of pregnancy
Advanced maternal age multigravida 46,
second trimester (declined screening)
Encounter for other antenatal screening
follow-up
OB History

Blood Type:            Height:  5'3"   Weight (lb):  145       BMI:
Gravidity:    5         Term:   3        Prem:   0        SAB:   1
TOP:          0       Ectopic:  0        Living: 3
Fetal Evaluation

Num Of Fetuses:     1
Fetal Heart         164
Rate(bpm):
Cardiac Activity:   Observed
Presentation:       Cephalic
Placenta:           Posterior, above cervical os
P. Cord Insertion:  Previously Visualized

Amniotic Fluid
AFI FV:      Subjectively within normal limits

AFI Sum(cm)     %Tile       Largest Pocket(cm)
11.34           30

RUQ(cm)       RLQ(cm)       LUQ(cm)        LLQ(cm)
1.88
Biometry

BPD:      87.1  mm     G. Age:  35w 1d         57  %    CI:        75.82   %    70 - 86
FL/HC:      20.8   %    20.1 -
HC:      317.1  mm     G. Age:  35w 4d         32  %    HC/AC:      1.02        0.93 -
AC:      310.7  mm     G. Age:  35w 0d         57  %    FL/BPD:     75.7   %    71 - 87
FL:       65.9  mm     G. Age:  34w 0d         18  %    FL/AC:      21.2   %    20 - 24
HUM:      57.1  mm     G. Age:  33w 1d         29  %

Est. FW:    3436  gm      5 lb 9 oz     57  %
Gestational Age

LMP:           35w 0d        Date:  12/29/16                 EDD:   10/05/17
U/S Today:     35w 0d                                        EDD:   10/05/17
Best:          35w 0d     Det. By:  LMP  (12/29/16)          EDD:   10/05/17
Anatomy

Cranium:               Appears normal         Aortic Arch:            Previously seen
Cavum:                 Appears normal         Ductal Arch:            Previously seen
Ventricles:            Appears normal         Diaphragm:              Previously seen
Choroid Plexus:        Previously seen        Stomach:                Appears normal, left
sided
Cerebellum:            Previously seen        Abdomen:                Appears normal
Posterior Fossa:       Previously seen        Abdominal Wall:         Previously seen
Nuchal Fold:           Previously seen        Cord Vessels:           Previously seen
Face:                  Orbits and profile     Kidneys:                Appear normal
previously seen
Lips:                  Previously seen        Bladder:                Appears normal
Thoracic:              Appears normal         Spine:                  Previously seen
Heart:                 Appears normal         Upper Extremities:      Previously seen
(4CH, axis, and situs
RVOT:                  Previously seen        Lower Extremities:      Previously seen
LVOT:                  Previously seen

Other:  Female gender. Heels visualized. 5th digits previously visualized.
Cervix Uterus Adnexa

Cervix
Not visualized (advanced GA >47wks)
Impression

SIUP at 35+0 weeks
Cephalic presentation
Normal interval anatomy; anatomic survey complete
Normal amniotic fluid volume
Appropriate interval growth with EFW at the 57th %tile
Recommendations

Antenatal testing

## 2018-08-24 IMAGING — US US FETAL BPP W/ NON-STRESS
1 series · 10 of 10 positions shown · non-contrast
Comparison: none

[Series 1: us fetal bpp w/nonstress · 10 acquisitions, 10 frames shown]
[im 1/10]
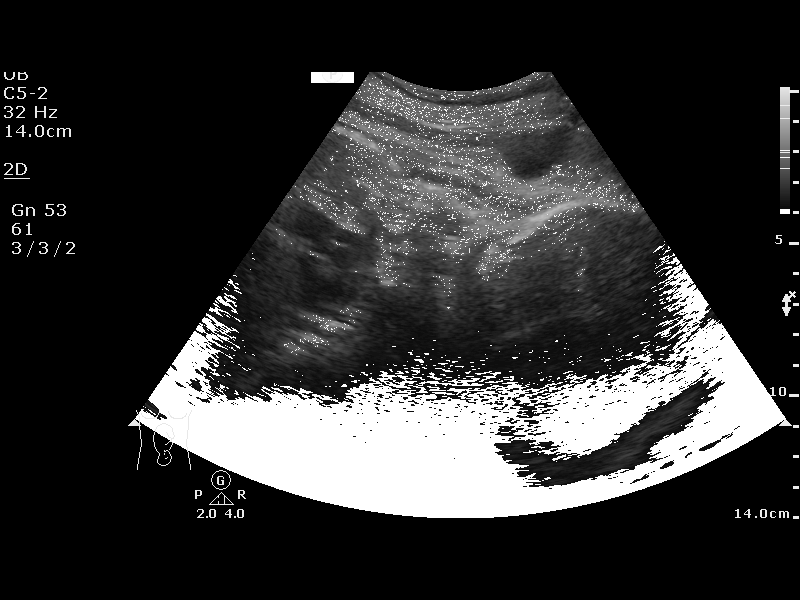
[im 2/10]
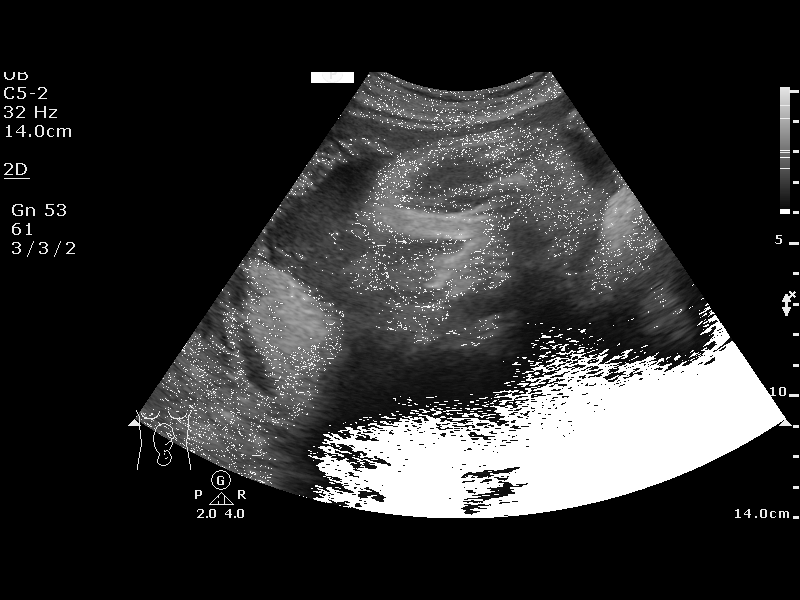
[im 3/10]
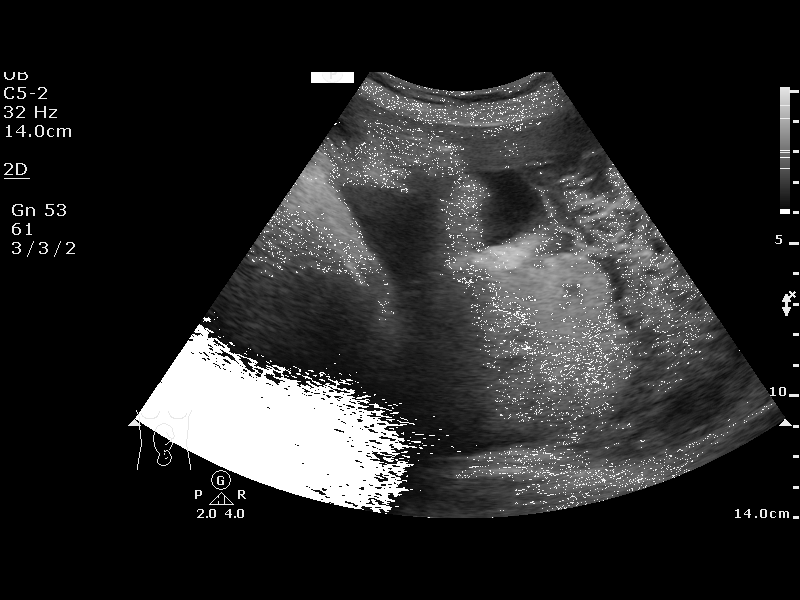
[im 4/10]
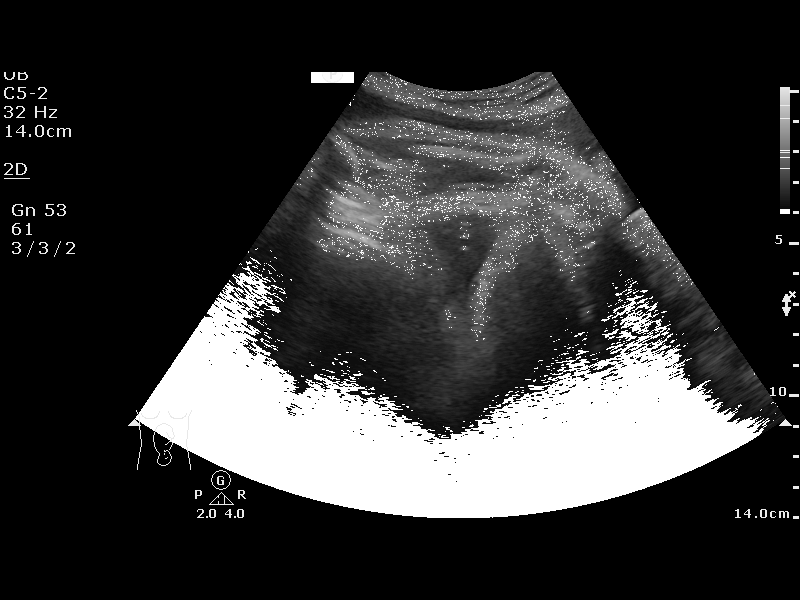
[im 5/10]
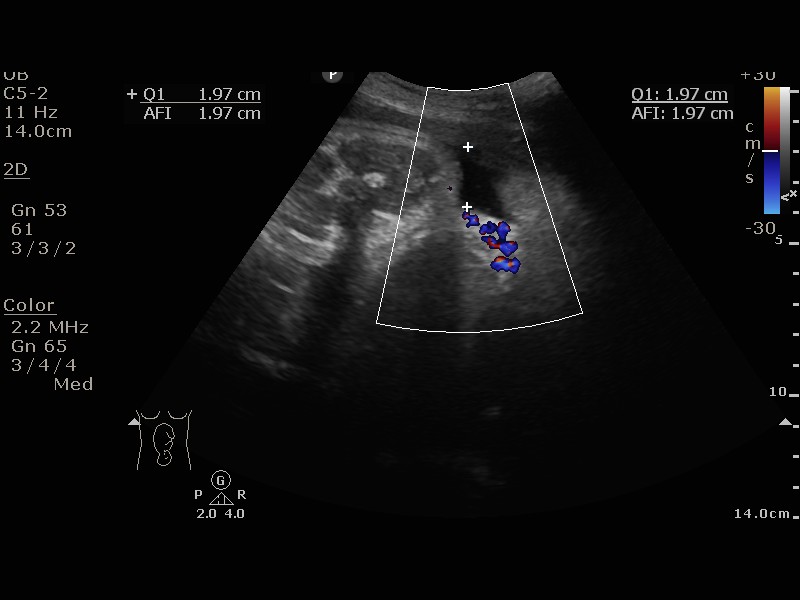
[im 6/10]
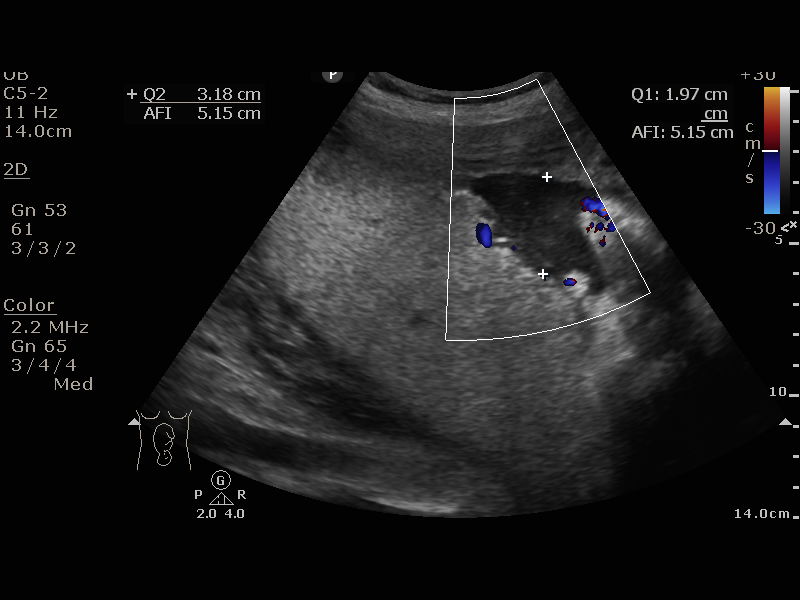
[im 7/10]
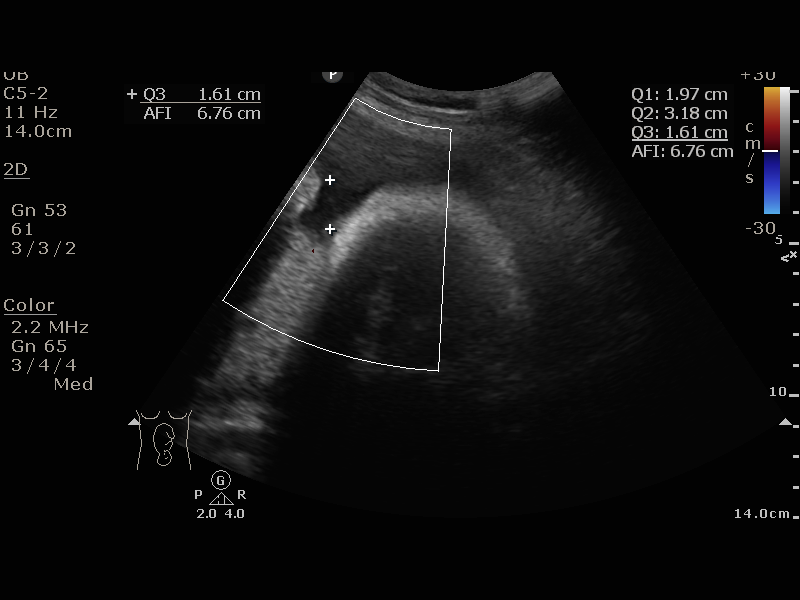
[im 8/10]
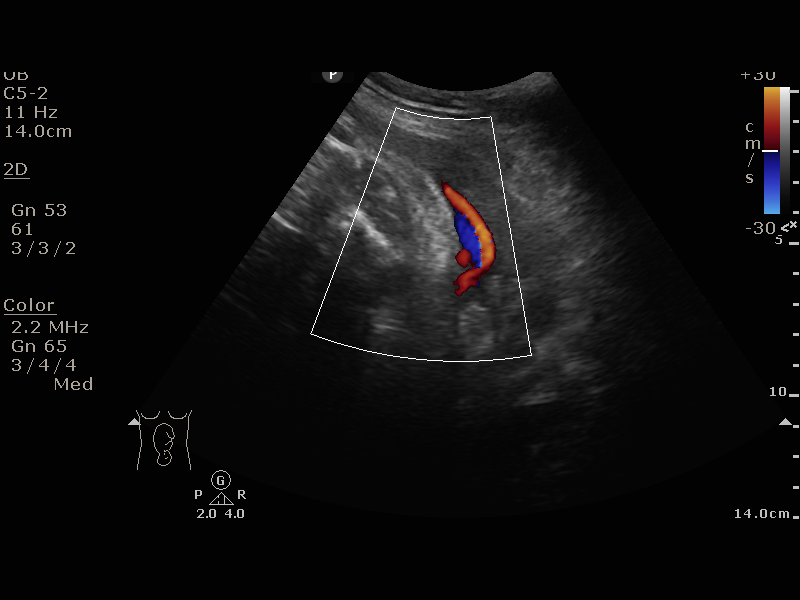
[im 9/10]
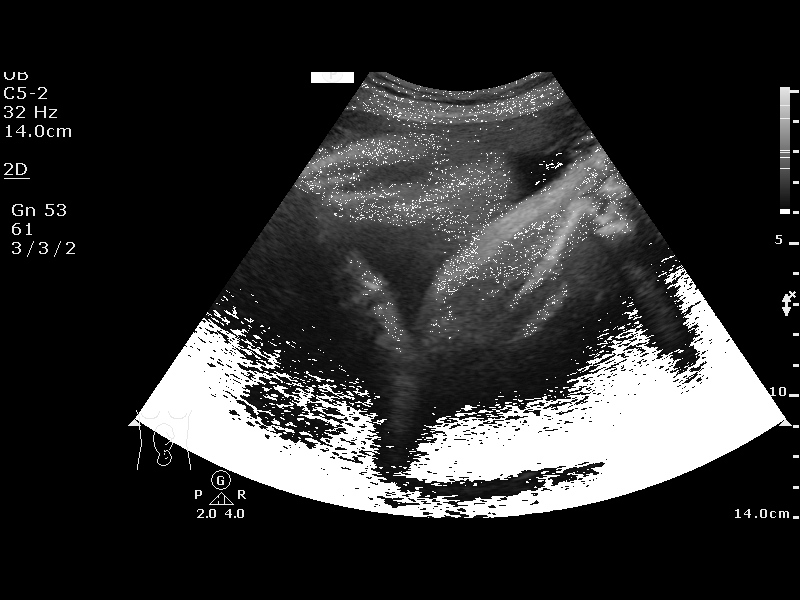
[im 10/10]
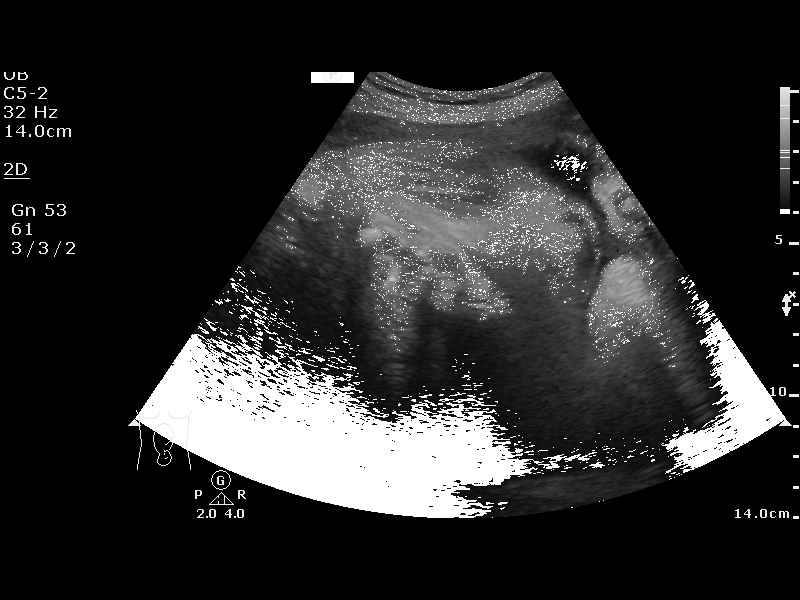

[10 of 10 positions shown; findings below may reference images not displayed]

[REDACTED]
Women's
[REDACTED]

1  US FETAL BPP W/NONSTRESS                    76818.4

1  BAGAZ KAMAIT              [id number]      [id number]     [id number]
Service(s) Provided

Indications

37 weeks gestation of pregnancy
Advanced maternal age multigravida 35+,
third trimester
OB History

Blood Type:            Height:  5'3"   Weight (lb):  145       BMI:
Gravidity:    5         Term:   3        Prem:   0        SAB:   1
TOP:          0       Ectopic:  0        Living: 3
Fetal Evaluation

Num Of Fetuses:     1
Preg. Location:     Intrauterine
Cardiac Activity:   Observed
Presentation:       Cephalic

Amniotic Fluid
AFI FV:      Subjectively low-normal

AFI Sum(cm)     %Tile       Largest Pocket(cm)
6.76            4

RUQ(cm)       RLQ(cm)       LUQ(cm)        LLQ(cm)
1.97          0
Biophysical Evaluation

Amniotic F.V:   Pocket => 2 cm two         F. Tone:        Observed
planes
F. Movement:    Observed                   N.S.T:          Reactive
F. Breathing:   Observed                   Score:          [DATE]
Gestational Age

LMP:           37w 4d        Date:  12/29/16                 EDD:   10/05/17
Best:          37w 4d     Det. By:  LMP  (12/29/16)          EDD:   10/05/17
Impression

BPP [DATE]
Recommendations

Continue antepartum testing

## 2018-11-16 ENCOUNTER — Ambulatory Visit (HOSPITAL_COMMUNITY)
Admission: EM | Admit: 2018-11-16 | Discharge: 2018-11-16 | Disposition: A | Discharge status: Home / Self Care | Payer: Self-pay | Attending: Family Medicine | Admitting: Family Medicine

## 2018-11-16 ENCOUNTER — Ambulatory Visit (INDEPENDENT_AMBULATORY_CARE_PROVIDER_SITE_OTHER): Payer: Self-pay

## 2018-11-16 ENCOUNTER — Encounter (HOSPITAL_COMMUNITY): Payer: Self-pay | Admitting: Emergency Medicine

## 2018-11-16 DIAGNOSIS — J111 Influenza due to unidentified influenza virus with other respiratory manifestations: Secondary | ICD-10-CM | POA: Insufficient documentation

## 2018-11-16 DIAGNOSIS — R05 Cough: Secondary | ICD-10-CM

## 2018-11-16 DIAGNOSIS — Z7401 Bed confinement status: Secondary | ICD-10-CM | POA: Insufficient documentation

## 2018-11-16 LAB — POCT RAPID STREP A: Streptococcus, Group A Screen (Direct): NEGATIVE

## 2018-11-16 MED ORDER — BENZONATATE 200 MG PO CAPS: 200.0000 mg | ORAL_CAPSULE | Freq: Two times a day (BID) | ORAL | 0 refills | Status: DC | PRN

## 2018-11-16 MED ORDER — SODIUM CHLORIDE 0.9 % IV BOLUS
1000.0000 mL | Freq: Once | INTRAVENOUS | Status: AC
  Administered 2018-11-16: 1000 mL via INTRAVENOUS

## 2018-11-16 MED ORDER — AZITHROMYCIN 250 MG PO TABS: 250.0000 mg | ORAL_TABLET | Freq: Every day | ORAL | 0 refills | Status: DC

## 2018-11-16 MED ORDER — IBUPROFEN 800 MG PO TABS: 800.0000 mg | ORAL_TABLET | Freq: Three times a day (TID) | ORAL | 0 refills | Status: DC

## 2018-11-16 MED ORDER — ACETAMINOPHEN 325 MG PO TABS
650.0000 mg | ORAL_TABLET | Freq: Once | ORAL | Status: AC
  Administered 2018-11-16: 650 mg via ORAL

## 2018-11-16 MED ORDER — ACETAMINOPHEN 325 MG PO TABS
ORAL_TABLET | ORAL | Status: AC
  Filled 2018-11-16: qty 2

## 2018-11-16 NOTE — Discharge Instructions (Signed)
Push fluids.  Continue to drink plenty of water at home Take the ibuprofen for pain and fever Take the tessalon for cough I believe you have influenza.  Antibiotics may or may not help at this point.  If the symptoms persist longer than a week would definitely need an antibiotic. I have prescribed azithromycin to take if you fail to improve.

## 2018-11-16 NOTE — ED Provider Notes (Signed)
[facility]    CSN: [id number] Arrival date & time: 11/16/18  [id number]     History   Chief Complaint No chief complaint on file.   HPI Danielle Bridges is a 47 y.o. female.   HPI  Patient is here for cough, sore throat, backache, body aches, severe fatigue, and high fever for 4 days.  She has been largely bedridden cared for her family.  She is taken some Tylenol.  Ibuprofen.  Still very tired and having sweats and shaking chills.  She is had some sore throat.  No runny or stuffy nose.  No cough.  Past Medical History:  Diagnosis Date  . Chlamydia infection affecting pregnancy     Patient Active Problem List   Diagnosis Date Noted  . Family history of consanguinity 05/11/2017  . 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 Language barrier, cultural differences 04/12/2017    Past Surgical History:  Procedure Laterality Date  . INCISION AND DRAINAGE    . OTHER SURGICAL HISTORY     femal circumcision  . THYROIDECTOMY N/A    Patient has not had thyroid removed  . TONSILLECTOMY      OB History    Gravida  5   Para  4   Term  4   Preterm      AB  1   Living  4     SAB  1   TAB      Ectopic      Multiple  0   Live Births  4            Home Medications    Prior to Admission medications   Medication Sig Start Date End Date Taking? Authorizing Provider  azithromycin (ZITHROMAX) 250 MG tablet Take 1 tablet (250 mg total) by mouth daily. Take first 2 tablets together, then 1 every day until finished. 11/16/18   Eustace MooreNelson, Storie Heffern Sue, MD  benzonatate (TESSALON) 200 MG capsule Take 1 capsule (200 mg total) by mouth 2 (two) times daily as needed for cough. 11/16/18   Eustace MooreNelson, Freddie Nghiem Sue, MD  ibuprofen (ADVIL,MOTRIN) 800 MG tablet Take 1 tablet (800 mg total) by mouth 3 (three) times daily. 11/16/18   Eustace MooreNelson, Ejay Lashley Sue, MD  Prenat w/o A Vit-FeFum-FePo-FA (CONCEPT OB) 130-92.4-1 MG CAPS Take 1 capsule by mouth daily. 05/10/17    Leftwich-Kirby, Wilmer FloorLisa A, CNM    Family History Family History  Problem Relation Age of Onset  . Hypertension Mother     Social History Social History   Tobacco Use  . Smoking status: Never Smoker  . Smokeless tobacco: Never Used  Substance Use Topics  . Alcohol use: No  . Drug use: No     Allergies   Patient has no known allergies.   Review of Systems Review of Systems  Constitutional: Positive for activity change, appetite change, chills, diaphoresis, fatigue and fever.  HENT: Positive for sore throat. Negative for ear pain.   Eyes: Negative for pain and visual disturbance.  Respiratory: Negative for cough and shortness of breath.   Cardiovascular: Negative for chest pain and palpitations.  Gastrointestinal: Negative for abdominal pain and vomiting.  Genitourinary: Negative for dysuria and hematuria.  Musculoskeletal: Positive for myalgias. Negative for arthralgias and back pain.  Skin: Negative for color change and rash.  Neurological: Negative for seizures and syncope.  All other systems reviewed and are negative.    Physical Exam Triage Vital Signs ED Triage Vitals [11/16/18 0908]  Enc Vitals Group  BP 123/75     Pulse Rate (!) 116     Resp 18     Temp (!) 102.5 F (39.2 C)     Temp src      SpO2 96 %     Weight      Height      Head Circumference      Peak Flow      Pain Score      Pain Loc      Pain Edu?      Excl. in GC?    No data found.  Updated Vital Signs BP 108/72   Pulse (!) 108   Temp 99.3 F (37.4 C)   Resp 16   LMP 11/16/2018   SpO2 94%   Visual Acuity Right Eye Distance:   Left Eye Distance:   Bilateral Distance:    Right Eye Near:   Left Eye Near:    Bilateral Near:     Physical Exam Constitutional:      General: She is not in acute distress.    Appearance: She is well-developed. She is ill-appearing.     Comments: Patient lays on the table.  Appears weak.  Tachycardic.  Daughter does most of the speaking for  her.  HENT:     Head: Normocephalic and atraumatic.     Right Ear: Tympanic membrane and ear canal normal.     Left Ear: Tympanic membrane and ear canal normal.     Nose: Nose normal.     Mouth/Throat:     Pharynx: Posterior oropharyngeal erythema present.  Eyes:     Conjunctiva/sclera: Conjunctivae normal.     Pupils: Pupils are equal, round, and reactive to light.  Neck:     Musculoskeletal: Normal range of motion.  Cardiovascular:     Rate and Rhythm: Tachycardia present.     Heart sounds: Normal heart sounds.  Pulmonary:     Effort: Pulmonary effort is normal. No respiratory distress.     Comments: Few scattered wheeze bilaterally , rhonchi centrally Abdominal:     General: There is no distension.     Palpations: Abdomen is soft.  Musculoskeletal: Normal range of motion.  Lymphadenopathy:     Cervical: No cervical adenopathy.  Skin:    General: Skin is warm and dry.  Neurological:     General: No focal deficit present.     Mental Status: She is alert. Mental status is at baseline.  Psychiatric:        Mood and Affect: Mood normal.        Thought Content: Thought content normal.      UC Treatments / Results  Labs (all labs ordered are listed, but only abnormal results are displayed) Labs Reviewed  CULTURE, GROUP A STREP [facility])  POCT RAPID STREP A    EKG None  Radiology Dg Chest 2 View  Result Date: 11/16/2018 CLINICAL DATA:  Four-day history of cough and fever. EXAM: CHEST - 2 VIEW COMPARISON:  None. FINDINGS: Cardiomediastinal silhouette unremarkable. Prominent bronchovascular markings diffusely and moderate to marked central peribronchial thickening. Minimal linear atelectasis in the lingula. Lungs otherwise clear. No localized airspace consolidation. No pleural effusions. No pneumothorax. Normal pulmonary vascularity. Visualized bony thorax intact. IMPRESSION: Moderate to severe changes of bronchitis and/or asthma without localized airspace pneumonia.  Electronically Signed   By: Hulan Saas M.D.   On: 11/16/2018 09:49    Procedures Procedures (including critical care time)  Medications Ordered in UC Medications  acetaminophen (TYLENOL) tablet  650 mg (650 mg Oral Given 11/16/18 0913)  sodium chloride 0.9 % bolus 1,000 mL (0 mLs Intravenous Stopped 11/16/18 1052)    Initial Impression / Assessment and Plan / UC Course  I have reviewed the triage vital signs and the nursing notes.  Pertinent labs & imaging results that were available during my care of the patient were reviewed by me and considered in my medical decision making (see chart for details).    Patient has influenza  with respiratory complications.  She is dehydrated.  Very sick.  It is too late to start or consider Tamiflu.  Symptomatic care is discussed.  Reasons for return discussed. Because of her illness I did go ahead and give her a liter of fluids.  She was observed for almost an hour.  She did feel somewhat better and was discharged home.  Final Clinical Impressions(s) / UC Diagnoses   Final diagnoses:  Influenza with respiratory manifestation     Discharge Instructions     Push fluids.  Continue to drink plenty of water at home Take the ibuprofen for pain and fever Take the tessalon for cough I believe you have influenza.  Antibiotics may or may not help at this point.  If the symptoms persist longer than a week would definitely need an antibiotic. I have prescribed azithromycin to take if you fail to improve.   ED Prescriptions    Medication Sig Dispense Auth. Provider   azithromycin (ZITHROMAX) 250 MG tablet Take 1 tablet (250 mg total) by mouth daily. Take first 2 tablets together, then 1 every day until finished. 6 tablet Eustace MooreNelson, Katrina Daddona Sue, MD   benzonatate (TESSALON) 200 MG capsule Take 1 capsule (200 mg total) by mouth 2 (two) times daily as needed for cough. 20 capsule Eustace MooreNelson, Mickael Mcnutt Sue, MD   ibuprofen (ADVIL,MOTRIN) 800 MG tablet Take 1 tablet  (800 mg total) by mouth 3 (three) times daily. 21 tablet Eustace MooreNelson, Seydou Hearns Sue, MD     Controlled Substance Prescriptions [place] Controlled Substance Registry consulted? Not Applicable   Eustace MooreNelson, Delphia Kaylor Sue, MD 11/16/18 2125

## 2018-11-16 NOTE — ED Triage Notes (Signed)
Pt c/o cough, sore throat, back ache x4 days.

## 2018-11-18 LAB — CULTURE, GROUP A STREP (THRC)

## 2019-10-14 ENCOUNTER — Observation Stay (HOSPITAL_COMMUNITY)
Admission: EM | Admit: 2019-10-14 | Discharge: 2019-10-15 | Disposition: A | Discharge status: Home / Self Care | Payer: BLUE CROSS/BLUE SHIELD | Attending: Surgery | Admitting: Surgery

## 2019-10-14 ENCOUNTER — Encounter (HOSPITAL_COMMUNITY)
Admission: EM | Disposition: A | Discharge status: Home / Self Care | Payer: Self-pay | Source: Home / Self Care | Attending: Emergency Medicine

## 2019-10-14 ENCOUNTER — Emergency Department (HOSPITAL_COMMUNITY): Payer: BLUE CROSS/BLUE SHIELD

## 2019-10-14 ENCOUNTER — Observation Stay (HOSPITAL_COMMUNITY): Payer: BLUE CROSS/BLUE SHIELD | Admitting: Certified Registered"

## 2019-10-14 ENCOUNTER — Ambulatory Visit (HOSPITAL_COMMUNITY)
Admission: EM | Admit: 2019-10-14 | Discharge: 2019-10-14 | Disposition: A | Discharge status: Home / Self Care | Payer: BLUE CROSS/BLUE SHIELD | Source: Home / Self Care

## 2019-10-14 ENCOUNTER — Encounter (HOSPITAL_COMMUNITY): Payer: Self-pay | Admitting: Emergency Medicine

## 2019-10-14 ENCOUNTER — Other Ambulatory Visit: Payer: Self-pay

## 2019-10-14 DIAGNOSIS — Z20828 Contact with and (suspected) exposure to other viral communicable diseases: Secondary | ICD-10-CM | POA: Diagnosis not present

## 2019-10-14 DIAGNOSIS — K3533 Acute appendicitis with perforation and localized peritonitis, with abscess: Principal | ICD-10-CM | POA: Insufficient documentation

## 2019-10-14 DIAGNOSIS — R109 Unspecified abdominal pain: Secondary | ICD-10-CM | POA: Diagnosis present

## 2019-10-14 DIAGNOSIS — K3580 Unspecified acute appendicitis: Secondary | ICD-10-CM | POA: Diagnosis present

## 2019-10-14 HISTORY — PX: LAPAROSCOPIC APPENDECTOMY: SHX408

## 2019-10-14 LAB — COMPREHENSIVE METABOLIC PANEL
ALT: 50 U/L — ABNORMAL HIGH (ref 0–44)
AST: 28 U/L (ref 15–41)
Albumin: 4 g/dL (ref 3.5–5.0)
Alkaline Phosphatase: 78 U/L (ref 38–126)
Anion gap: 7 (ref 5–15)
BUN: 10 mg/dL (ref 6–20)
CO2: 25 mmol/L (ref 22–32)
Calcium: 9 mg/dL (ref 8.9–10.3)
Chloride: 105 mmol/L (ref 98–111)
Creatinine, Ser: 0.83 mg/dL (ref 0.44–1.00)
GFR calc Af Amer: >60 mL/min (ref >60)
GFR calc non Af Amer: >60 mL/min (ref >60)
Glucose, Bld: 120 mg/dL — ABNORMAL HIGH (ref 70–99)
Potassium: 3.9 mmol/L (ref 3.5–5.1)
Sodium: 137 mmol/L (ref 135–145)
Total Bilirubin: 0.5 mg/dL (ref 0.3–1.2)
Total Protein: 7.2 g/dL (ref 6.5–8.1)

## 2019-10-14 LAB — URINALYSIS, ROUTINE W REFLEX MICROSCOPIC
Bacteria, UA: NONE SEEN
Bilirubin Urine: NEGATIVE
Glucose, UA: NEGATIVE mg/dL
Ketones, ur: NEGATIVE mg/dL
Leukocytes,Ua: NEGATIVE
Nitrite: NEGATIVE
Protein, ur: NEGATIVE mg/dL
Specific Gravity, Urine: 1.021 (ref 1.005–1.030)
pH: 5 (ref 5.0–8.0)

## 2019-10-14 LAB — CBC
HCT: 46.7 % — ABNORMAL HIGH (ref 36.0–46.0)
Hemoglobin: 15 g/dL (ref 12.0–15.0)
MCH: 28.8 pg (ref 26.0–34.0)
MCHC: 32.1 g/dL (ref 30.0–36.0)
MCV: 89.6 fL (ref 80.0–100.0)
Platelets: 332 10*3/uL (ref 150–400)
RBC: 5.21 MIL/uL — ABNORMAL HIGH (ref 3.87–5.11)
RDW: 12.6 % (ref 11.5–15.5)
WBC: 11.5 10*3/uL — ABNORMAL HIGH (ref 4.0–10.5)
nRBC: 0 % (ref 0.0–0.2)

## 2019-10-14 LAB — SARS CORONAVIRUS 2 BY RT PCR (HOSPITAL ORDER, PERFORMED IN ~~LOC~~ HOSPITAL LAB): SARS Coronavirus 2: NEGATIVE

## 2019-10-14 LAB — I-STAT BETA HCG BLOOD, ED (MC, WL, AP ONLY): I-stat hCG, quantitative: <5 m[IU]/mL (ref <5)

## 2019-10-14 LAB — LIPASE, BLOOD: Lipase: 28 U/L (ref 11–51)

## 2019-10-14 SURGERY — APPENDECTOMY, LAPAROSCOPIC
Anesthesia: General

## 2019-10-14 MED ORDER — ONDANSETRON HCL 4 MG/2ML IJ SOLN: 4.0000 mg | Freq: Four times a day (QID) | INTRAMUSCULAR | Status: DC | PRN

## 2019-10-14 MED ORDER — BUPIVACAINE HCL (PF) 0.25 % IJ SOLN
INTRAMUSCULAR | Status: AC
  Filled 2019-10-14: qty 30

## 2019-10-14 MED ORDER — DIPHENHYDRAMINE HCL 50 MG/ML IJ SOLN: 25.0000 mg | Freq: Four times a day (QID) | INTRAMUSCULAR | Status: DC | PRN

## 2019-10-14 MED ORDER — ACETAMINOPHEN 500 MG PO TABS
1000.0000 mg | ORAL_TABLET | Freq: Four times a day (QID) | ORAL | Status: DC
  Administered 2019-10-14 – 2019-10-15 (×2): 1000 mg via ORAL
  Filled 2019-10-14 (×2): qty 2

## 2019-10-14 MED ORDER — SUGAMMADEX SODIUM 200 MG/2ML IV SOLN
INTRAVENOUS | Status: DC | PRN
  Administered 2019-10-14: 200 mg via INTRAVENOUS

## 2019-10-14 MED ORDER — DOCUSATE SODIUM 100 MG PO CAPS: 100.0000 mg | ORAL_CAPSULE | Freq: Two times a day (BID) | ORAL | 0 refills | Status: AC

## 2019-10-14 MED ORDER — ACETAMINOPHEN 500 MG PO TABS
1000.0000 mg | ORAL_TABLET | ORAL | Status: AC
  Administered 2019-10-14: 17:00:00 1000 mg via ORAL
  Filled 2019-10-14: qty 2

## 2019-10-14 MED ORDER — LIDOCAINE 2% (20 MG/ML) 5 ML SYRINGE
INTRAMUSCULAR | Status: AC
  Filled 2019-10-14: qty 5

## 2019-10-14 MED ORDER — IOHEXOL 300 MG/ML  SOLN
100.0000 mL | Freq: Once | INTRAMUSCULAR | Status: AC | PRN
  Administered 2019-10-14: 14:00:00 100 mL via INTRAVENOUS

## 2019-10-14 MED ORDER — ONDANSETRON HCL 4 MG/2ML IJ SOLN
INTRAMUSCULAR | Status: AC
  Filled 2019-10-14: qty 2

## 2019-10-14 MED ORDER — METHOCARBAMOL 500 MG PO TABS
500.0000 mg | ORAL_TABLET | Freq: Four times a day (QID) | ORAL | Status: DC | PRN
  Administered 2019-10-14 – 2019-10-15 (×2): 500 mg via ORAL
  Filled 2019-10-14 (×2): qty 1

## 2019-10-14 MED ORDER — MIDAZOLAM HCL 2 MG/2ML IJ SOLN
INTRAMUSCULAR | Status: AC
  Filled 2019-10-14: qty 2

## 2019-10-14 MED ORDER — HYDROMORPHONE HCL 1 MG/ML IJ SOLN
INTRAMUSCULAR | Status: AC
  Administered 2019-10-14: 19:00:00 0.5 mg via INTRAVENOUS
  Filled 2019-10-14: qty 1

## 2019-10-14 MED ORDER — METRONIDAZOLE IN NACL 5-0.79 MG/ML-% IV SOLN
500.0000 mg | Freq: Three times a day (TID) | INTRAVENOUS | Status: DC
  Administered 2019-10-14 – 2019-10-15 (×3): 500 mg via INTRAVENOUS
  Filled 2019-10-14 (×3): qty 100

## 2019-10-14 MED ORDER — MIDAZOLAM HCL 5 MG/5ML IJ SOLN
INTRAMUSCULAR | Status: DC | PRN
  Administered 2019-10-14: 2 mg via INTRAVENOUS

## 2019-10-14 MED ORDER — PHENYLEPHRINE 40 MCG/ML (10ML) SYRINGE FOR IV PUSH (FOR BLOOD PRESSURE SUPPORT)
PREFILLED_SYRINGE | INTRAVENOUS | Status: DC | PRN
  Administered 2019-10-14 (×2): 120 ug via INTRAVENOUS

## 2019-10-14 MED ORDER — FENTANYL CITRATE (PF) 250 MCG/5ML IJ SOLN
INTRAMUSCULAR | Status: DC | PRN
  Administered 2019-10-14: 50 ug via INTRAVENOUS

## 2019-10-14 MED ORDER — KETOROLAC TROMETHAMINE 15 MG/ML IJ SOLN: 15.0000 mg | Freq: Four times a day (QID) | INTRAMUSCULAR | Status: DC | PRN

## 2019-10-14 MED ORDER — ROCURONIUM BROMIDE 50 MG/5ML IV SOSY
PREFILLED_SYRINGE | INTRAVENOUS | Status: DC | PRN
  Administered 2019-10-14: 50 mg via INTRAVENOUS

## 2019-10-14 MED ORDER — DEXAMETHASONE SODIUM PHOSPHATE 10 MG/ML IJ SOLN
INTRAMUSCULAR | Status: AC
  Filled 2019-10-14: qty 1

## 2019-10-14 MED ORDER — HYDROMORPHONE HCL 1 MG/ML IJ SOLN: 0.5000 mg | INTRAMUSCULAR | Status: DC | PRN

## 2019-10-14 MED ORDER — TRAMADOL HCL 50 MG PO TABS: 50.0000 mg | ORAL_TABLET | Freq: Four times a day (QID) | ORAL | 0 refills | Status: DC | PRN

## 2019-10-14 MED ORDER — SODIUM CHLORIDE 0.9 % IV SOLN
2.0000 g | INTRAVENOUS | Status: DC
  Administered 2019-10-14: 16:00:00 2 g via INTRAVENOUS
  Filled 2019-10-14 (×2): qty 20

## 2019-10-14 MED ORDER — PHENOL 1.4 % MT LIQD
1.0000 | OROMUCOSAL | Status: DC | PRN
  Administered 2019-10-14: 21:00:00 1 via OROMUCOSAL
  Filled 2019-10-14: qty 177

## 2019-10-14 MED ORDER — DEXAMETHASONE SODIUM PHOSPHATE 10 MG/ML IJ SOLN
INTRAMUSCULAR | Status: DC | PRN
  Administered 2019-10-14: 10 mg via INTRAVENOUS

## 2019-10-14 MED ORDER — PROMETHAZINE HCL 25 MG/ML IJ SOLN: 6.2500 mg | INTRAMUSCULAR | Status: DC | PRN

## 2019-10-14 MED ORDER — SODIUM CHLORIDE 0.9 % IV SOLN: INTRAVENOUS | Status: DC

## 2019-10-14 MED ORDER — FENTANYL CITRATE (PF) 250 MCG/5ML IJ SOLN
INTRAMUSCULAR | Status: AC
  Filled 2019-10-14: qty 5

## 2019-10-14 MED ORDER — SUCCINYLCHOLINE CHLORIDE 200 MG/10ML IV SOSY
PREFILLED_SYRINGE | INTRAVENOUS | Status: AC
  Filled 2019-10-14: qty 10

## 2019-10-14 MED ORDER — DIPHENHYDRAMINE HCL 25 MG PO CAPS: 25.0000 mg | ORAL_CAPSULE | Freq: Four times a day (QID) | ORAL | Status: DC | PRN

## 2019-10-14 MED ORDER — LIDOCAINE 2% (20 MG/ML) 5 ML SYRINGE
INTRAMUSCULAR | Status: DC | PRN
  Administered 2019-10-14: 100 mg via INTRAVENOUS

## 2019-10-14 MED ORDER — MEPERIDINE HCL 25 MG/ML IJ SOLN: 6.2500 mg | INTRAMUSCULAR | Status: DC | PRN

## 2019-10-14 MED ORDER — SODIUM CHLORIDE 0.9 % IV BOLUS (SEPSIS)
250.0000 mL | Freq: Once | INTRAVENOUS | Status: AC
  Administered 2019-10-14: 12:00:00 250 mL via INTRAVENOUS

## 2019-10-14 MED ORDER — DOCUSATE SODIUM 100 MG PO CAPS
100.0000 mg | ORAL_CAPSULE | Freq: Two times a day (BID) | ORAL | Status: DC
  Administered 2019-10-14: 100 mg via ORAL
  Filled 2019-10-14: qty 1

## 2019-10-14 MED ORDER — ONDANSETRON 4 MG PO TBDP: 4.0000 mg | ORAL_TABLET | Freq: Four times a day (QID) | ORAL | Status: DC | PRN

## 2019-10-14 MED ORDER — IBUPROFEN 600 MG PO TABS: 600.0000 mg | ORAL_TABLET | Freq: Four times a day (QID) | ORAL | Status: DC | PRN

## 2019-10-14 MED ORDER — ONDANSETRON HCL 4 MG/2ML IJ SOLN
4.0000 mg | Freq: Once | INTRAMUSCULAR | Status: AC
  Administered 2019-10-14: 12:00:00 4 mg via INTRAVENOUS
  Filled 2019-10-14: qty 2

## 2019-10-14 MED ORDER — GABAPENTIN 300 MG PO CAPS
300.0000 mg | ORAL_CAPSULE | ORAL | Status: AC
  Administered 2019-10-14: 17:00:00 300 mg via ORAL
  Filled 2019-10-14: qty 1

## 2019-10-14 MED ORDER — HYDRALAZINE HCL 20 MG/ML IJ SOLN: 10.0000 mg | INTRAMUSCULAR | Status: DC | PRN

## 2019-10-14 MED ORDER — TRAMADOL HCL 50 MG PO TABS
50.0000 mg | ORAL_TABLET | Freq: Four times a day (QID) | ORAL | Status: DC | PRN
  Administered 2019-10-14 – 2019-10-15 (×2): 50 mg via ORAL
  Filled 2019-10-14 (×2): qty 1

## 2019-10-14 MED ORDER — SUCCINYLCHOLINE CHLORIDE 20 MG/ML IJ SOLN
INTRAMUSCULAR | Status: DC | PRN
  Administered 2019-10-14: 120 mg via INTRAVENOUS

## 2019-10-14 MED ORDER — METOPROLOL TARTRATE 5 MG/5ML IV SOLN: 5.0000 mg | Freq: Four times a day (QID) | INTRAVENOUS | Status: DC | PRN

## 2019-10-14 MED ORDER — ONDANSETRON HCL 4 MG/2ML IJ SOLN
INTRAMUSCULAR | Status: DC | PRN
  Administered 2019-10-14: 4 mg via INTRAVENOUS

## 2019-10-14 MED ORDER — 0.9 % SODIUM CHLORIDE (POUR BTL) OPTIME
TOPICAL | Status: DC | PRN
  Administered 2019-10-14: 1000 mL

## 2019-10-14 MED ORDER — PHENYLEPHRINE HCL-NACL 10-0.9 MG/250ML-% IV SOLN
INTRAVENOUS | Status: DC | PRN
  Administered 2019-10-14: 50 ug/min via INTRAVENOUS

## 2019-10-14 MED ORDER — LACTATED RINGERS IV SOLN
INTRAVENOUS | Status: DC | PRN
  Administered 2019-10-14: 17:00:00 via INTRAVENOUS

## 2019-10-14 MED ORDER — PROPOFOL 10 MG/ML IV BOLUS
INTRAVENOUS | Status: AC
  Filled 2019-10-14: qty 40

## 2019-10-14 MED ORDER — LACTATED RINGERS IV SOLN
INTRAVENOUS | Status: DC
  Administered 2019-10-14: 17:00:00 via INTRAVENOUS

## 2019-10-14 MED ORDER — SODIUM CHLORIDE 0.9 % IV SOLN
1000.0000 mL | INTRAVENOUS | Status: DC
  Administered 2019-10-14: 1000 mL via INTRAVENOUS

## 2019-10-14 MED ORDER — MORPHINE SULFATE (PF) 4 MG/ML IV SOLN
4.0000 mg | Freq: Once | INTRAVENOUS | Status: AC
  Administered 2019-10-14: 12:00:00 4 mg via INTRAVENOUS
  Filled 2019-10-14: qty 1

## 2019-10-14 MED ORDER — HYDROMORPHONE HCL 1 MG/ML IJ SOLN
0.2500 mg | INTRAMUSCULAR | Status: DC | PRN
  Administered 2019-10-14 (×2): 0.5 mg via INTRAVENOUS

## 2019-10-14 MED ORDER — PROPOFOL 10 MG/ML IV BOLUS
INTRAVENOUS | Status: DC | PRN
  Administered 2019-10-14: 150 mg via INTRAVENOUS

## 2019-10-14 MED ORDER — BUPIVACAINE HCL (PF) 0.25 % IJ SOLN
INTRAMUSCULAR | Status: DC | PRN
  Administered 2019-10-14: 17 mL

## 2019-10-14 MED ORDER — SODIUM CHLORIDE 0.9 % IR SOLN
Status: DC | PRN
  Administered 2019-10-14: 1000 mL

## 2019-10-14 SURGICAL SUPPLY — 44 items
APPLIER CLIP 5 13 M/L LIGAMAX5 (MISCELLANEOUS)
BLADE CLIPPER SURG (BLADE) IMPLANT
CANISTER SUCT 3000ML PPV (MISCELLANEOUS) ×3 IMPLANT
CATH FOLEY 2WAY SLVR  5CC 16FR (CATHETERS) ×2
CATH FOLEY 2WAY SLVR 5CC 16FR (CATHETERS) IMPLANT
CHLORAPREP W/TINT 26 (MISCELLANEOUS) ×3 IMPLANT
CLIP APPLIE 5 13 M/L LIGAMAX5 (MISCELLANEOUS) IMPLANT
COVER SURGICAL LIGHT HANDLE (MISCELLANEOUS) ×3 IMPLANT
COVER WAND RF STERILE (DRAPES) ×1 IMPLANT
CUTTER FLEX LINEAR 45M (STAPLE) ×3 IMPLANT
DERMABOND ADVANCED (GAUZE/BANDAGES/DRESSINGS) ×2
DERMABOND ADVANCED .7 DNX12 (GAUZE/BANDAGES/DRESSINGS) ×1 IMPLANT
ELECT REM PT RETURN 9FT ADLT (ELECTROSURGICAL) ×3
ELECTRODE REM PT RTRN 9FT ADLT (ELECTROSURGICAL) ×1 IMPLANT
GLOVE BIO SURGEON STRL SZ 6 (GLOVE) ×3 IMPLANT
GLOVE INDICATOR 6.5 STRL GRN (GLOVE) ×3 IMPLANT
GOWN STRL REUS W/ TWL LRG LVL3 (GOWN DISPOSABLE) ×3 IMPLANT
GOWN STRL REUS W/TWL LRG LVL3 (GOWN DISPOSABLE) ×6
GRASPER SUT TROCAR 14GX15 (MISCELLANEOUS) ×3 IMPLANT
KIT BASIN OR (CUSTOM PROCEDURE TRAY) ×3 IMPLANT
KIT TURNOVER KIT B (KITS) ×3 IMPLANT
NDL INSUFFLATION 14GA 120MM (NEEDLE) ×1 IMPLANT
NEEDLE INSUFFLATION 14GA 120MM (NEEDLE) ×3 IMPLANT
NS IRRIG 1000ML POUR BTL (IV SOLUTION) ×3 IMPLANT
PAD ARMBOARD 7.5X6 YLW CONV (MISCELLANEOUS) ×4 IMPLANT
POUCH SPECIMEN RETRIEVAL 10MM (ENDOMECHANICALS) ×3 IMPLANT
RELOAD 45 VASCULAR/THIN (ENDOMECHANICALS) ×3 IMPLANT
RELOAD STAPLE 45 2.5 WHT GRN (ENDOMECHANICALS) IMPLANT
RELOAD STAPLE 45 3.5 BLU ETS (ENDOMECHANICALS) IMPLANT
RELOAD STAPLE TA45 3.5 REG BLU (ENDOMECHANICALS) IMPLANT
SCISSORS LAP 5X35 DISP (ENDOMECHANICALS) IMPLANT
SET IRRIG TUBING LAPAROSCOPIC (IRRIGATION / IRRIGATOR) ×3 IMPLANT
SET TUBE SMOKE EVAC HIGH FLOW (TUBING) ×3 IMPLANT
SHEARS HARMONIC ACE PLUS 36CM (ENDOMECHANICALS) ×2 IMPLANT
SLEEVE ENDOPATH XCEL 5M (ENDOMECHANICALS) ×3 IMPLANT
SPECIMEN JAR SMALL (MISCELLANEOUS) ×3 IMPLANT
SUT MNCRL AB 4-0 PS2 18 (SUTURE) ×3 IMPLANT
TOWEL GREEN STERILE FF (TOWEL DISPOSABLE) ×3 IMPLANT
TRAY FOLEY W/BAG SLVR 16FR (SET/KITS/TRAYS/PACK) ×2
TRAY FOLEY W/BAG SLVR 16FR ST (SET/KITS/TRAYS/PACK) ×1 IMPLANT
TRAY LAPAROSCOPIC MC (CUSTOM PROCEDURE TRAY) ×3 IMPLANT
TROCAR XCEL 12X100 BLDLESS (ENDOMECHANICALS) ×3 IMPLANT
TROCAR XCEL NON-BLD 5MMX100MML (ENDOMECHANICALS) ×3 IMPLANT
WATER STERILE IRR 1000ML POUR (IV SOLUTION) ×3 IMPLANT

## 2019-10-14 NOTE — Discharge Instructions (Signed)
[facility] LAPAROSCOPIC SURGERY: POST OP INSTRUCTIONS Always review your discharge instruction sheet given to you by the facility where your surgery was performed. IF YOU HAVE DISABILITY OR FAMILY LEAVE FORMS, YOU MUST BRING THEM TO THE OFFICE FOR PROCESSING.   DO NOT GIVE THEM TO YOUR DOCTOR.  PAIN CONTROL  1. First take acetaminophen (Tylenol) AND/or ibuprofen (Advil) to control your pain after surgery.  Follow directions on package.  Taking acetaminophen (Tylenol) and/or ibuprofen (Advil) regularly after surgery will help to control your pain and lower the amount of prescription pain medication you may need.  You should not take more than 3,000 mg (3 grams) of acetaminophen (Tylenol) in 24 hours.  You should not take ibuprofen (Advil), aleve, motrin, naprosyn or other NSAIDS if you have a history of stomach ulcers or chronic kidney disease.  2. A prescription for pain medication may be given to you upon discharge.  Take your pain medication as prescribed, if you still have uncontrolled pain after taking acetaminophen (Tylenol) or ibuprofen (Advil). 3. Use ice packs to help control pain. 4. If you need a refill on your pain medication, please contact your pharmacy.  They will contact our office to request authorization. Prescriptions will not be filled after 5pm or on week-ends.  HOME MEDICATIONS 5. Take your usually prescribed medications unless otherwise directed.  DIET 6. You should follow a light diet the first few days after arrival home.  Be sure to include lots of fluids daily. Avoid fatty, fried foods.   CONSTIPATION 7. It is common to experience some constipation after surgery and if you are taking pain medication.  Increasing fluid intake and taking a stool softener (such as Colace) will usually help or prevent this problem from occurring.  A mild laxative (Milk of Magnesia or Miralax) should be taken according to package instructions if there are no bowel  movements after 48 hours.  WOUND/INCISION CARE 8. Most patients will experience some swelling and bruising in the area of the incisions.  Ice packs will help.  Swelling and bruising can take several days to resolve.  9. Unless discharge instructions indicate otherwise, follow guidelines below  a. STERI-STRIPS - you may remove your outer bandages 48 hours after surgery, and you may shower at that time.  You have steri-strips (small skin tapes) in place directly over the incision.  These strips should be left on the skin for 7-10 days.   b. DERMABOND/SKIN GLUE - you may shower in 24 hours.  The glue will flake off over the next 2-3 weeks. 10. Any sutures or staples will be removed at the office during your follow-up visit.  ACTIVITIES 11. You may resume regular (light) daily activities beginning the next day--such as daily self-care, walking, climbing stairs--gradually increasing activities as tolerated.  You may have sexual intercourse when it is comfortable.  Refrain from any heavy lifting or straining until approved by your doctor. a. You may drive when you are no longer taking prescription pain medication, you can comfortably wear a seatbelt, and you can safely maneuver your car and apply brakes.  FOLLOW-UP 12. You should see your doctor in the office for a follow-up appointment approximately 2-3 weeks after your surgery.  You should have been given your post-op/follow-up appointment when your surgery was scheduled.  If you did not receive a post-op/follow-up appointment, make sure that you call for this appointment within a day or two after you arrive home to insure a convenient appointment time.     WHEN TO CALL YOUR DOCTOR: 1. Fever over 101.0 2. Inability to urinate 3. Continued bleeding from incision. 4. Increased pain, redness, or drainage from the incision. 5. Increasing abdominal pain  The clinic staff is available to answer your questions during regular business hours.  Please don't  hesitate to call and ask to speak to one of the nurses for clinical concerns.  If you have a medical emergency, go to the nearest emergency room or call 911.  A surgeon from [facility] is always on call at the hospital. [address], [address], [place], [place]  [address] ? [address] [address], [place], [place]   [address] [phone] ? [phone] ? FAX [phone] Web site: [email or web address]  .........   Managing Your Pain After Surgery Without Opioids    Thank you for participating in our program to help patients manage their pain after surgery without opioids. This is part of our effort to provide you with the best care possible, without exposing you or your family to the risk that opioids pose.  What pain can I expect after surgery? You can expect to have some pain after surgery. This is normal. The pain is typically worse the day after surgery, and quickly begins to get better. Many studies have found that many patients are able to manage their pain after surgery with Over-the-Counter (OTC) medications such as Tylenol and Motrin. If you have a condition that does not allow you to take Tylenol or Motrin, notify your surgical team.  How will I manage my pain? The best strategy for controlling your pain after surgery is around the clock pain control with Tylenol (acetaminophen) and Motrin (ibuprofen or Advil). Alternating these medications with each other allows you to maximize your pain control. In addition to Tylenol and Motrin, you can use heating pads or ice packs on your incisions to help reduce your pain.  How will I alternate your regular strength over-the-counter pain medication? You will take a dose of pain medication every three hours. ; Start by taking 650 mg of Tylenol (2 pills of 325 mg) ; 3 hours later take 600 mg of Motrin (3 pills of 200 mg) ; 3 hours after taking the Motrin take 650 mg of Tylenol ; 3 hours after that take 600 mg of  Motrin.   - 1 -  See example - if your first dose of Tylenol is at 12:00 PM   12:00 PM Tylenol 650 mg (2 pills of 325 mg)  3:00 PM Motrin 600 mg (3 pills of 200 mg)  6:00 PM Tylenol 650 mg (2 pills of 325 mg)  9:00 PM Motrin 600 mg (3 pills of 200 mg)  Continue alternating every 3 hours   We recommend that you follow this schedule around-the-clock for at least 3 days after surgery, or until you feel that it is no longer needed. Use the table on the last page of this handout to keep track of the medications you are taking. Important: Do not take more than 3000mg of Tylenol or 3200mg of Motrin in a 24-hour period. Do not take ibuprofen/Motrin if you have a history of bleeding stomach ulcers, severe kidney disease, &/or actively taking a blood thinner  What if I still have pain? If you have pain that is not controlled with the over-the-counter pain medications (Tylenol and Motrin or Advil) you might have what we call "breakthrough" pain. You will receive a prescription for a small amount of an opioid pain medication such as   Oxycodone, Tramadol, or Tylenol with Codeine. Use these opioid pills in the first 24 hours after surgery if you have breakthrough pain. Do not take more than 1 pill every 4-6 hours.  If you still have uncontrolled pain after using all opioid pills, don't hesitate to call our staff using the number provided. We will help make sure you are managing your pain in the best way possible, and if necessary, we can provide a prescription for additional pain medication.   Day 1    Time  Name of Medication Number of pills taken  Amount of Acetaminophen  Pain Level   Comments  AM PM       AM PM       AM PM       AM PM       AM PM       AM PM       AM PM       AM PM       Total Daily amount of Acetaminophen Do not take more than  3,000 mg per day      Day 2    Time  Name of Medication Number of pills taken  Amount of Acetaminophen  Pain Level   Comments  AM  PM       AM PM       AM PM       AM PM       AM PM       AM PM       AM PM       AM PM       Total Daily amount of Acetaminophen Do not take more than  3,000 mg per day      Day 3    Time  Name of Medication Number of pills taken  Amount of Acetaminophen  Pain Level   Comments  AM PM       AM PM       AM PM       AM PM          AM PM       AM PM       AM PM       AM PM       Total Daily amount of Acetaminophen Do not take more than  3,000 mg per day      Day 4    Time  Name of Medication Number of pills taken  Amount of Acetaminophen  Pain Level   Comments  AM PM       AM PM       AM PM       AM PM       AM PM       AM PM       AM PM       AM PM       Total Daily amount of Acetaminophen Do not take more than  3,000 mg per day      Day 5    Time  Name of Medication Number of pills taken  Amount of Acetaminophen  Pain Level   Comments  AM PM       AM PM       AM PM       AM PM       AM PM       AM PM       AM PM         AM PM       Total Daily amount of Acetaminophen Do not take more than  3,000 mg per day       Day 6    Time  Name of Medication Number of pills taken  Amount of Acetaminophen  Pain Level  Comments  AM PM       AM PM       AM PM       AM PM       AM PM       AM PM       AM PM       AM PM       Total Daily amount of Acetaminophen Do not take more than  3,000 mg per day      Day 7    Time  Name of Medication Number of pills taken  Amount of Acetaminophen  Pain Level   Comments  AM PM       AM PM       AM PM       AM PM       AM PM       AM PM       AM PM       AM PM       Total Daily amount of Acetaminophen Do not take more than  3,000 mg per day        For additional information about how and where to safely dispose of unused opioid medications - https://www.morepowerfulnc.org  Disclaimer: This document contains information and/or instructional materials adapted from [facility]  for the typical patient with your condition. It does not replace medical advice from your health care provider because your experience may differ from that of the typical patient. Talk to your health care provider if you have any questions about this document, your condition or your treatment plan. Adapted from [facility]  

## 2019-10-14 NOTE — ED Notes (Signed)
Pt ambulatory to bathroom without any problems 

## 2019-10-14 NOTE — ED Notes (Signed)
ED TO INPATIENT HANDOFF REPORT  ED Nurse Name and Phone #: [phone]  S Name/Age/Gender Danielle Bridges 48 y.o. female Room/Bed: 030C/030C  Code Status   Code Status: Prior  Home/SNF/Other Home Patient oriented to: self, place, time and situation Is this baseline? Yes   Triage Complete: Triage complete  Chief Complaint Abd pain sent by dr  Triage Note Pt endorses generalized abd pain starting today with nausea. Has not vomited. Denies any urinary symptoms.    Allergies No Known Allergies  Level of Care/Admitting Diagnosis ED Disposition    ED Disposition Condition Comment   Admit  The patient appears reasonably stabilized for admission considering the current resources, flow, and capabilities available in the ED at this time, and I doubt any other [facility] requiring further screening and/or treatment in the ED prior to admission is  present.       B Medical/Surgery History Past Medical History:  Diagnosis Date  . Chlamydia infection affecting pregnancy    Past Surgical History:  Procedure Laterality Date  . INCISION AND DRAINAGE    . OTHER SURGICAL HISTORY     femal circumcision  . THYROIDECTOMY N/A    Patient has not had thyroid removed  . TONSILLECTOMY       A IV Location/Drains/Wounds Patient Lines/Drains/Airways Status   Active Line/Drains/Airways    Name:   Placement date:   Placement time:   Site:   Days:   Peripheral IV 10/14/19 Right Antecubital   10/14/19    1210    Antecubital   less than 1          Intake/Output Last 24 hours No intake or output data in the 24 hours ending 10/14/19 1412  Labs/Imaging Results for orders placed or performed during the hospital encounter of 10/14/19 (from the past 48 hour(s))  Urinalysis, Routine w reflex microscopic     Status: Abnormal   Collection Time: 10/14/19 10:58 AM  Result Value Ref Range   Color, Urine YELLOW YELLOW   APPearance HAZY (A) CLEAR   Specific Gravity, Urine 1.021 1.005 - 1.030   pH 5.0 5.0  - 8.0   Glucose, UA NEGATIVE NEGATIVE mg/dL   Hgb urine dipstick MODERATE (A) NEGATIVE   Bilirubin Urine NEGATIVE NEGATIVE   Ketones, ur NEGATIVE NEGATIVE mg/dL   Protein, ur NEGATIVE NEGATIVE mg/dL   Nitrite NEGATIVE NEGATIVE   Leukocytes,Ua NEGATIVE NEGATIVE   RBC / HPF 0-5 0 - 5 RBC/hpf   WBC, UA 0-5 0 - 5 WBC/hpf   Bacteria, UA NONE SEEN NONE SEEN   Squamous Epithelial / LPF 0-5 0 - 5   Mucus PRESENT     Comment: Performed at [facility], [address], [place], [place] [address]  Lipase, blood     Status: None   Collection Time: 10/14/19 11:00 AM  Result Value Ref Range   Lipase 28 11 - 51 U/L    Comment: Performed at [facility], [address] [place], [place] [address]  Comprehensive metabolic panel     Status: Abnormal   Collection Time: 10/14/19 11:00 AM  Result Value Ref Range   Sodium 137 135 - 145 mmol/L   Potassium 3.9 3.5 - 5.1 mmol/L   Chloride 105 98 - 111 mmol/L   CO2 25 22 - 32 mmol/L   Glucose, Bld 120 (H) 70 - 99 mg/dL   BUN 10 6 - 20 mg/dL   Creatinine, Ser 2.13 0.44 - 1.00 mg/dL   Calcium 9.0 8.9 -  10.3 mg/dL   Total Protein 7.2 6.5 - 8.1 g/dL   Albumin 4.0 3.5 - 5.0 g/dL   AST 28 15 - 41 U/L   ALT 50 (H) 0 - 44 U/L   Alkaline Phosphatase 78 38 - 126 U/L   Total Bilirubin 0.5 0.3 - 1.2 mg/dL   GFR calc non Af Amer >60 >60 mL/min   GFR calc Af Amer >60 >60 mL/min   Anion gap 7 5 - 15    Comment: Performed at [facility], [address] [place], [place] [address]  CBC     Status: Abnormal   Collection Time: 10/14/19 11:00 AM  Result Value Ref Range   WBC 11.5 (H) 4.0 - 10.5 K/uL   RBC 5.21 (H) 3.87 - 5.11 MIL/uL   Hemoglobin 15.0 12.0 - 15.0 g/dL   HCT 01.046.7 (H) 27.236.0 - 53.646.0 %   MCV 89.6 80.0 - 100.0 fL   MCH 28.8 26.0 - 34.0 pg   MCHC 32.1 30.0 - 36.0 g/dL   RDW 64.412.6 03.411.5 - 74.215.5 %   Platelets 332 150 - 400 K/uL   nRBC 0.0 0.0 - 0.2 %    Comment: Performed at [facility], [address] [place], [place] [address]   I-Stat beta hCG blood, ED     Status: None   Collection Time: 10/14/19 11:17 AM  Result Value Ref Range   I-stat hCG, quantitative <5.0 <5 mIU/mL   Comment 3            Comment:   GEST. AGE      CONC.  (mIU/mL)   <=1 WEEK        5 - 50     2 WEEKS       50 - 500     3 WEEKS       100 - 10,000     4 WEEKS     1,000 - 30,000        FEMALE AND NON-PREGNANT FEMALE:     LESS THAN 5 mIU/mL    Ct Abdomen Pelvis W Contrast  Result Date: 10/14/2019 CLINICAL DATA:  Acute onset lower abdominal pain this morning. EXAM: CT ABDOMEN AND PELVIS WITH CONTRAST TECHNIQUE: Multidetector CT imaging of the abdomen and pelvis was performed using the standard protocol following bolus administration of intravenous contrast. CONTRAST:  100mL OMNIPAQUE IOHEXOL 300 MG/ML  SOLN COMPARISON:  None. FINDINGS: Lower chest: Mild dependent atelectasis bilaterally. Trace bilateral pleural fluid. Heart size normal. No pericardial effusion. Hepatobiliary: Liver and gallbladder are unremarkable. No biliary ductal dilatation. Pancreas: Negative. Spleen: Negative. Adrenals/Urinary Tract: Adrenal glands and kidneys are unremarkable. Ureters are decompressed. Bladder is grossly unremarkable. Stomach/Bowel: Stomach and small bowel are unremarkable. The appendix is dilated and fluid-filled, measuring up to 11 mm near the tip. No appendicoliths. Mild periappendiceal inflammatory stranding. Colon is unremarkable. Vascular/Lymphatic: Vascular structures are unremarkable. No pathologically enlarged lymph nodes. Reproductive: Uterus is visualized. Prominent pelvic veins bilaterally. No adnexal mass. Other: No free fluid. Mesenteries and peritoneum are otherwise unremarkable. Musculoskeletal: None. IMPRESSION: 1. Acute appendicitis without rupture or abscess. 2. Trace bilateral pleural fluid. Electronically Signed   By: Leanna BattlesMelinda  Blietz M.D.   On: 10/14/2019 13:46    Pending Labs Unresulted Labs (From admission, onward)   None       Vitals/Pain Today's Vitals   10/14/19 1230 10/14/19 1233 10/14/19 1259 10/14/19 1304  BP: 110/71  102/64   Pulse:   73   Resp:  18   Temp:      TempSrc:      SpO2:   100%   Weight:      Height:      PainSc:  2  0-No pain 0-No pain    Isolation Precautions No active isolations  Medications Medications  sodium chloride 0.9 % bolus 250 mL (0 mLs Intravenous Stopped 10/14/19 1257)    Followed by  0.9 %  sodium chloride infusion (1,000 mLs Intravenous New Bag/Given 10/14/19 1258)  morphine 4 MG/ML injection 4 mg (4 mg Intravenous Given 10/14/19 1220)  ondansetron (ZOFRAN) injection 4 mg (4 mg Intravenous Given 10/14/19 1220)  iohexol (OMNIPAQUE) 300 MG/ML solution 100 mL (100 mLs Intravenous Contrast Given 10/14/19 1334)    Mobility walks Low fall risk   Focused Assessments    R Recommendations: See Admitting Provider Note  Report given to:   Additional Notes:  Patient only speaks [place]

## 2019-10-14 NOTE — H&P (Signed)
Surgical   CC: H&P  HPI: History obtained with video interpreter, patient speaks Arabic, she is from [place].  This is a otherwise healthy 48 year old woman who developed abdominal pain yesterday.  This gradually worsened and localized to the right lower quadrant.  This has been associated with nausea and several episodes of emesis.  She has not had prior similar symptoms.  No fevers. No prior abdominal surgeries.  No Known Allergies  Past Medical History:  Diagnosis Date  . Chlamydia infection affecting pregnancy     Past Surgical History:  Procedure Laterality Date  . INCISION AND DRAINAGE    . OTHER SURGICAL HISTORY     femal circumcision  . THYROIDECTOMY N/A    Patient has not had thyroid removed  . TONSILLECTOMY      Family History  Problem Relation Age of Onset  . Hypertension Mother     Social History   Socioeconomic History  . Marital status: Married    Spouse name: Not on file  . Number of children: Not on file  . Years of education: Not on file  . Highest education level: Not on file  Occupational History  . Not on file  Social Needs  . Financial resource strain: Not on file  . Food insecurity    Worry: Not on file    Inability: Not on file  . Transportation needs    Medical: Not on file    Non-medical: Not on file  Tobacco Use  . Smoking status: Never Smoker  . Smokeless tobacco: Never Used  Substance and Sexual Activity  . Alcohol use: No  . Drug use: No  . Sexual activity: Yes    Birth control/protection: None    Comment: married  Lifestyle  . Physical activity    Days per week: Not on file    Minutes per session: Not on file  . Stress: Not on file  Relationships  . Social Musician on phone: Not on file    Gets together: Not on file    Attends religious service: Not on file    Active member of club or organization: Not on file    Attends meetings of clubs or organizations: Not on file    Relationship status: Not on file  Other  Topics Concern  . Not on file  Social History Narrative  . Not on file    No current facility-administered medications on file prior to encounter.    Current Outpatient Medications on File Prior to Encounter  Medication Sig Dispense Refill  . amoxicillin-clavulanate (AUGMENTIN) 875-125 MG tablet Take 1 tablet by mouth 2 (two) times daily.      Review of Systems: a complete, 10pt review of systems was completed with pertinent positives and negatives as documented in the HPI  Physical Exam: Vitals:   10/14/19 1230 10/14/19 1259  BP: 110/71 102/64  Pulse:  73  Resp:  18  Temp:    SpO2:  100%   Gen: A&Ox3, no distress  Head: normocephalic, atraumatic Eyes: extraocular motions intact, anicteric.  Chest: unlabored respirations, symmetrical air entry Cardiovascular: RRR with palpable distal pulses, no pedal edema Abdomen: soft, nondistended, tender in the right lower quadrant with voluntary guarding, no peritonitis. No mass or organomegaly.  Extremities: warm, without edema, no deformities  Neuro: grossly intact Psych: appropriate mood and affect, normal insight  Skin: warm and dry, henna tattoos on fingertips and soles of feet   CBC Latest Ref Rng & Units 10/14/2019 09/20/2017  07/12/2017  WBC 4.0 - 10.5 K/uL 11.5(H) 10.9(H) 7.2  Hemoglobin 12.0 - 15.0 g/dL 15.0 14.9 12.1  Hematocrit 36.0 - 46.0 % 46.7(H) 44.2 37.5  Platelets 150 - 400 K/uL 332 208 239    CMP Latest Ref Rng & Units 10/14/2019 02/22/2017  Glucose 70 - 99 mg/dL 120(H) 83  BUN 6 - 20 mg/dL 10 5(L)  Creatinine 0.44 - 1.00 mg/dL 0.83 0.65  Sodium 135 - 145 mmol/L 137 139  Potassium 3.5 - 5.1 mmol/L 3.9 4.2  Chloride 98 - 111 mmol/L 105 105  CO2 22 - 32 mmol/L 25 21  Calcium 8.9 - 10.3 mg/dL 9.0 9.2  Total Protein 6.5 - 8.1 g/dL 7.2 6.1  Total Bilirubin 0.3 - 1.2 mg/dL 0.5 0.2  Alkaline Phos 38 - 126 U/L 78 50  AST 15 - 41 U/L 28 11  ALT 0 - 44 U/L 50(H) 10    No results found for: INR,  PROTIME  Imaging: Ct Abdomen Pelvis W Contrast  Result Date: 10/14/2019 CLINICAL DATA:  Acute onset lower abdominal pain this morning. EXAM: CT ABDOMEN AND PELVIS WITH CONTRAST TECHNIQUE: Multidetector CT imaging of the abdomen and pelvis was performed using the standard protocol following bolus administration of intravenous contrast. CONTRAST:  197mL OMNIPAQUE IOHEXOL 300 MG/ML  SOLN COMPARISON:  None. FINDINGS: Lower chest: Mild dependent atelectasis bilaterally. Trace bilateral pleural fluid. Heart size normal. No pericardial effusion. Hepatobiliary: Liver and gallbladder are unremarkable. No biliary ductal dilatation. Pancreas: Negative. Spleen: Negative. Adrenals/Urinary Tract: Adrenal glands and kidneys are unremarkable. Ureters are decompressed. Bladder is grossly unremarkable. Stomach/Bowel: Stomach and small bowel are unremarkable. The appendix is dilated and fluid-filled, measuring up to 11 mm near the tip. No appendicoliths. Mild periappendiceal inflammatory stranding. Colon is unremarkable. Vascular/Lymphatic: Vascular structures are unremarkable. No pathologically enlarged lymph nodes. Reproductive: Uterus is visualized. Prominent pelvic veins bilaterally. No adnexal mass. Other: No free fluid. Mesenteries and peritoneum are otherwise unremarkable. Musculoskeletal: None. IMPRESSION: 1. Acute appendicitis without rupture or abscess. 2. Trace bilateral pleural fluid. Electronically Signed   By: Lorin Picket M.D.   On: 10/14/2019 13:46     A/P: Acute appendicitis. I recommend proceeding with laparoscopic appendectomy. We discussed the surgery including risks of bleeding, infection, pain, scarring, injury to intra-abdominal structures, conversion to open surgery or more extensive resection, risk of staple line leak or delayed abscess, failure to resolve symptoms, postoperative ileus, incisional hernia, as well as general risks of DVT/PE, pneumonia, stroke, heart attack, death. Questions were  welcomed and answered to the patient's satisfaction. We'll proceed to the operating room today pending results of Covid screen.  Contact: Daughter, Jackquline Denmark [phone]   Patient Active Problem List   Diagnosis Date Noted  . Family history of consanguinity 05/11/2017  . 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 Language barrier, cultural differences 04/12/2017       Romana Juniper, MD [facility]  See AMION to contact appropriate on-call provider

## 2019-10-14 NOTE — Anesthesia Procedure Notes (Signed)
Procedure Name: Intubation Date/Time: 10/14/2019 5:09 PM Performed by: Griffin Dakin, CRNA Pre-anesthesia Checklist: Patient identified, Emergency Drugs available, Suction available and Patient being monitored Patient Re-evaluated:Patient Re-evaluated prior to induction Oxygen Delivery Method: Circle system utilized Preoxygenation: Pre-oxygenation with 100% oxygen Induction Type: Rapid sequence and Cricoid Pressure applied Laryngoscope Size: Mac and 4 Grade View: Grade I Tube type: Oral Tube size: 7.0 mm Number of attempts: 1 Airway Equipment and Method: Stylet and Oral airway Placement Confirmation: ETT inserted through vocal cords under direct vision,  positive ETCO2 and breath sounds checked- equal and bilateral Secured at: 22 cm Tube secured with: Tape Dental Injury: Teeth and Oropharynx as per pre-operative assessment

## 2019-10-14 NOTE — ED Provider Notes (Signed)
[facility] EMERGENCY DEPARTMENT Provider Note   CSN: [id number] Arrival date & time: 10/14/19  1043     History   Chief Complaint Chief Complaint  Patient presents with  . Abdominal Pain    HPI Danielle Bridges is a 48 y.o. female presenting for evaluation of n/v and abd pain.   History obtained with assistance of [facility] video interpreter.  Patient states he started to develop pain last night.  Pain is in her right lower quadrant.  It is constant.  Is worse with movement and when having a bowel movement.  Nothing makes it better.  She is not taken anything for pain including Tylenol ibuprofen.  This morning, her pain was worse.  She now has associated nausea and vomiting, has been up 3 times.  She denies hematemesis.  She denies fevers, chills, sore throat, cough, chest pain, shortness of breath, urinary symptoms, abnormal bowel movements.  She has no medical problems, takes medications daily.  She has never had any abdominal problems or previous abdominal surgeries.  Her last oral intake was last night.     HPI  Past Medical History:  Diagnosis Date  . Chlamydia infection affecting pregnancy     Patient Active Problem List   Diagnosis Date Noted  . Family history of consanguinity 05/11/2017  . 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 Language barrier, cultural differences 04/12/2017    Past Surgical History:  Procedure Laterality Date  . INCISION AND DRAINAGE    . OTHER SURGICAL HISTORY     femal circumcision  . THYROIDECTOMY N/A    Patient has not had thyroid removed  . TONSILLECTOMY       OB History    Gravida  5   Para  4   Term  4   Preterm      AB  1   Living  4     SAB  1   TAB      Ectopic      Multiple  0   Live Births  4            Home Medications    Prior to Admission medications   Medication Sig Start Date End Date Taking? Authorizing Provider  amoxicillin-clavulanate (AUGMENTIN) 875-125 MG  tablet Take 1 tablet by mouth 2 (two) times daily. 10/04/19   [provider]    Family History Family History  Problem Relation Age of Onset  . Hypertension Mother     Social History Social History   Tobacco Use  . Smoking status: Never Smoker  . Smokeless tobacco: Never Used  Substance Use Topics  . Alcohol use: No  . Drug use: No     Allergies   Patient has no known allergies.   Review of Systems Review of Systems  Gastrointestinal: Positive for abdominal pain, nausea and vomiting.  All other systems reviewed and are negative.    Physical Exam Updated Vital Signs BP 102/64 (BP Location: Left Arm)   Pulse 73   Temp 97.7 F (36.5 C) (Oral)   Resp 18   Ht 5\' 1"  (1.549 m)   Wt 70.8 kg   LMP 10/14/2019   SpO2 100%   BMI 29.48 kg/m   Physical Exam Vitals signs and nursing note reviewed.  Constitutional:      General: She is not in acute distress.    Appearance: She is well-developed.     Comments: Appears nontoxic  HENT:     Head: Normocephalic  and atraumatic.  Eyes:     Conjunctiva/sclera: Conjunctivae normal.     Pupils: Pupils are equal, round, and reactive to light.  Neck:     Musculoskeletal: Normal range of motion and neck supple.  Cardiovascular:     Rate and Rhythm: Normal rate and regular rhythm.     Pulses: Normal pulses.  Pulmonary:     Effort: Pulmonary effort is normal. No respiratory distress.     Breath sounds: Normal breath sounds. No wheezing.  Abdominal:     General: There is no distension.     Palpations: Abdomen is soft. There is no mass.     Tenderness: There is abdominal tenderness. There is no guarding or rebound.     Comments: Tenderness palpation of the lower abdomen, worse on the right side.  No rigidity, guarding, distention.  Negative rebound.  No signs of peritonitis.  Musculoskeletal: Normal range of motion.  Skin:    General: Skin is warm and dry.     Capillary Refill: Capillary refill takes less than 2  seconds.  Neurological:     Mental Status: She is alert and oriented to person, place, and time.      ED Treatments / Results  Labs (all labs ordered are listed, but only abnormal results are displayed) Labs Reviewed  COMPREHENSIVE METABOLIC PANEL - Abnormal; Notable for the following components:      Result Value   Glucose, Bld 120 (*)    ALT 50 (*)    All other components within normal limits  CBC - Abnormal; Notable for the following components:   WBC 11.5 (*)    RBC 5.21 (*)    HCT 46.7 (*)    All other components within normal limits  URINALYSIS, ROUTINE W REFLEX MICROSCOPIC - Abnormal; Notable for the following components:   APPearance HAZY (*)    Hgb urine dipstick MODERATE (*)    All other components within normal limits  LIPASE, BLOOD  I-STAT BETA HCG BLOOD, ED ([facility], [facility], [facility] ONLY)    EKG None  Radiology Ct Abdomen Pelvis W Contrast  Result Date: 10/14/2019 CLINICAL DATA:  Acute onset lower abdominal pain this morning. EXAM: CT ABDOMEN AND PELVIS WITH CONTRAST TECHNIQUE: Multidetector CT imaging of the abdomen and pelvis was performed using the standard protocol following bolus administration of intravenous contrast. CONTRAST:  170mL OMNIPAQUE IOHEXOL 300 MG/ML  SOLN COMPARISON:  None. FINDINGS: Lower chest: Mild dependent atelectasis bilaterally. Trace bilateral pleural fluid. Heart size normal. No pericardial effusion. Hepatobiliary: Liver and gallbladder are unremarkable. No biliary ductal dilatation. Pancreas: Negative. Spleen: Negative. Adrenals/Urinary Tract: Adrenal glands and kidneys are unremarkable. Ureters are decompressed. Bladder is grossly unremarkable. Stomach/Bowel: Stomach and small bowel are unremarkable. The appendix is dilated and fluid-filled, measuring up to 11 mm near the tip. No appendicoliths. Mild periappendiceal inflammatory stranding. Colon is unremarkable. Vascular/Lymphatic: Vascular structures are unremarkable. No pathologically enlarged lymph  nodes. Reproductive: Uterus is visualized. Prominent pelvic veins bilaterally. No adnexal mass. Other: No free fluid. Mesenteries and peritoneum are otherwise unremarkable. Musculoskeletal: None. IMPRESSION: 1. Acute appendicitis without rupture or abscess. 2. Trace bilateral pleural fluid. Electronically Signed   By: Lorin Picket M.D.   On: 10/14/2019 13:46    Procedures Procedures (including critical care time)  Medications Ordered in ED Medications  sodium chloride 0.9 % bolus 250 mL (0 mLs Intravenous Stopped 10/14/19 1257)    Followed by  0.9 %  sodium chloride infusion (1,000 mLs Intravenous New Bag/Given 10/14/19 1258)  morphine 4  MG/ML injection 4 mg (4 mg Intravenous Given 10/14/19 1220)  ondansetron (ZOFRAN) injection 4 mg (4 mg Intravenous Given 10/14/19 1220)  iohexol (OMNIPAQUE) 300 MG/ML solution 100 mL (100 mLs Intravenous Contrast Given 10/14/19 1334)     Initial Impression / Assessment and Plan / ED Course  I have reviewed the triage vital signs and the nursing notes.  Pertinent labs & imaging results that were available during my care of the patient were reviewed by me and considered in my medical decision making (see chart for details).  Clinical Course as of Oct 13 1441  Mon Oct 14, 2019  1412 Patient was seen by myself as well as PA provider.  48 year old female who is non-English speaking, presented to the emergency department with abdominal pain.  Patient was found to have an appendicitis on her CT scan.  Plan to speak to surgery of admit.  Patient is otherwise stable   [MT]    Clinical Course User Index [MT] Terald Sleeperrifan, Matthew J, MD       Patient presenting for evaluation of right lower quadrant abdominal pain, nausea, vomiting.  Physical exam shows patient appears nontoxic.  She does have focal right lower quadrant pain, concern for appendicitis.  Also consider kidney stone versus ovarian cyst versus torsion.  Labs obtained from triage show white count of 11.   Otherwise reassuring.  Will obtain CT for further evaluation.  Morphine and Zofran given for symptom control.  On reassessment, patient reports pain and nausea is improved.  CT is consistent with acute appendicitis.  Will consult with general surgery.  Discussed with general surgery PA, patient to be admitted.  Will obtain rapid Covid test.  Final Clinical Impressions(s) / ED Diagnoses   Final diagnoses:  Acute appendicitis, unspecified acute appendicitis type    ED Discharge Orders    None       Alveria ApleyCaccavale, [place], PA-C 10/14/19 1449    Terald Sleeperrifan, Matthew J, MD 10/14/19 1512

## 2019-10-14 NOTE — ED Notes (Addendum)
Pt's VSS, Dr. Mannie Stabile aware.  Family in agreement to take her to ED for further evaluation.  Pt ambulated well out of the facility with two family members.  Pt's family states pt woke up about 8 hours ago with the pain and it has been getting progressively worse over the last 8 hours.

## 2019-10-14 NOTE — ED Notes (Signed)
Pt sitting in lobby, appears to be in a lot of pain.  Per Leigh, at the front desk, pt is having issues with walking and sat down immediately upon arrival.  Pt is complaining of intense RLQ pain and states she can not push on the area without a lot of pain.  She is with family that is interpreting for her. Dr. Mannie Stabile aware.  Pt will be get vital signs in the triage room and if stable, Dr. Mannie Stabile has requested she go to the ED for further evaluation.  She is with family so they will be able to escort her there.

## 2019-10-14 NOTE — Progress Notes (Signed)
Patient arrived to the unit. This nurse utilized assistance of [employer] Arabic interpretation to ask admission questions and orient patient to room and [employer]. 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 At patient request, this nurse called patient's daughter Rahaf whom speaks english and updated her on visitation policy of [place]. Patient states throat irritation. This nurse will get orders for throat spray to assist with discomfort.

## 2019-10-14 NOTE — Op Note (Signed)
Operative Report  Danielle Bridges 48 y.o. female  [id number]  [id number]  10/14/2019  Surgeon: Clovis Riley   Assistant: none  Procedure performed: Laparoscopic Appendectomy  Preop diagnosis: Acute appendicitis  Post-op diagnosis/intraop findings: Acute appendicitis  Specimens: appendix  EBL: minimal  Complications: none  Description of procedure: After obtaining informed consent the patient was brought to the operating room. Antibiotics were administered. SCD's were applied. Foley catheter inserted which is removed at the end of the case. General endotracheal anesthesia was initiated and a formal time-out was performed. The abdomen was prepped and draped in the usual sterile fashion and the abdomen was entered using an infraumbilical Veress needle and insufflated to 15 mmHg. A 5 mm trocar and camera were then introduced, the abdomen was inspected and there is no evidence of injury from our entry. A suprapubic 5 mm trocar and a left lower quadrant 12 mm trocar were introduced under direct visualization following infiltration with local. The patient was then placed in Trendelenburg and rotated to the left and the small bowel was reflected cephalad. The appendix was visualized: it is acutely inflamed with scant purulent exudate on the distal half, no free fluid was present. Harmonic scalpel was used to transect the appendix from its mesentery, skeltonizing it down to its juncture with the cecum. A white load 36mm endoGIA stapler was then used to divide the appendix from the cecum, taking a small cuff of healthy cecum with the specimen. Hemostasis was ensured. The appendix was placed in an Endo Catch bag and removed through our 12 mm trocar. The 7mm trocar site in the left lower quadrant was closed with a 0 vicryl in the fascia under direct visualization using a PMI device. The abdomen was desufflated and all trocars removed. The skin incisions were closed with subcuticular monocryl and  Dermabond. The patient was awakened, extubated and transported to the recovery room in stable condition.   All counts were correct at the completion of the case.

## 2019-10-14 NOTE — ED Triage Notes (Signed)
Pt endorses generalized abd pain starting today with nausea. Has not vomited. Denies any urinary symptoms.

## 2019-10-14 NOTE — Transfer of Care (Signed)
Immediate Anesthesia Transfer of Care Note  Patient: Danielle Bridges  Procedure(s) Performed: APPENDECTOMY LAPAROSCOPIC (N/A )  Patient Location: PACU  Anesthesia Type:General  Level of Consciousness: awake and alert   Airway & Oxygen Therapy: Patient Spontanous Breathing and Patient connected to nasal cannula oxygen  Post-op Assessment: Report given to RN and Post -op Vital signs reviewed and stable  Post vital signs: Reviewed and stable  Last Vitals:  Vitals Value Taken Time  BP 108/59 10/14/19 1813  Temp    Pulse 101   Resp 19 10/14/19 1813  SpO2 100   Vitals shown include unvalidated device data.  Last Pain:  Vitals:   10/14/19 1559  TempSrc:   PainSc: 0-No pain         Complications: No apparent anesthesia complications

## 2019-10-14 NOTE — Anesthesia Preprocedure Evaluation (Addendum)
Anesthesia Evaluation  Patient identified by MRN, date of birth, ID band Patient awake    Reviewed: Allergy & Precautions, NPO status , Patient's Chart, lab work & pertinent test results  Airway Mallampati: II  TM Distance: >3 FB Neck ROM: Full    Dental no notable dental hx. (+) Teeth Intact, Dental Advisory Given   Pulmonary neg pulmonary ROS,    Pulmonary exam normal breath sounds clear to auscultation       Cardiovascular negative cardio ROS Normal cardiovascular exam Rhythm:Regular Rate:Normal     Neuro/Psych negative neurological ROS  negative psych ROS   GI/Hepatic Neg liver ROS, GERD  ,  Endo/Other  negative endocrine ROS  Renal/GU negative Renal ROS     Musculoskeletal negative musculoskeletal ROS (+)   Abdominal   Peds  Hematology negative hematology ROS (+)   Anesthesia Other Findings   Reproductive/Obstetrics AMA                             Anesthesia Physical  Anesthesia Plan  ASA: II and emergent  Anesthesia Plan: General   Post-op Pain Management:    Induction: Intravenous, Rapid sequence and Cricoid pressure planned  PONV Risk Score and Plan:   Airway Management Planned: Oral ETT  Additional Equipment:   Intra-op Plan:   Post-operative Plan: Extubation in OR  Informed Consent: I have reviewed the patients History and Physical, chart, labs and discussed the procedure including the risks, benefits and alternatives for the proposed anesthesia with the patient or authorized representative who has indicated his/her understanding and acceptance.     Dental advisory given  Plan Discussed with: CRNA  Anesthesia Plan Comments:         Anesthesia Quick Evaluation

## 2019-10-15 ENCOUNTER — Encounter (HOSPITAL_COMMUNITY): Payer: Self-pay | Admitting: Surgery

## 2019-10-15 LAB — CBC
HCT: 43.6 % (ref 36.0–46.0)
Hemoglobin: 14.3 g/dL (ref 12.0–15.0)
MCH: 29 pg (ref 26.0–34.0)
MCHC: 32.8 g/dL (ref 30.0–36.0)
MCV: 88.4 fL (ref 80.0–100.0)
Platelets: 322 10*3/uL (ref 150–400)
RBC: 4.93 MIL/uL (ref 3.87–5.11)
RDW: 12.6 % (ref 11.5–15.5)
WBC: 11.7 10*3/uL — ABNORMAL HIGH (ref 4.0–10.5)
nRBC: 0 % (ref 0.0–0.2)

## 2019-10-15 LAB — BASIC METABOLIC PANEL
Anion gap: 8 (ref 5–15)
BUN: 8 mg/dL (ref 6–20)
CO2: 21 mmol/L — ABNORMAL LOW (ref 22–32)
Calcium: 8.4 mg/dL — ABNORMAL LOW (ref 8.9–10.3)
Chloride: 108 mmol/L (ref 98–111)
Creatinine, Ser: 0.65 mg/dL (ref 0.44–1.00)
GFR calc Af Amer: >60 mL/min (ref >60)
GFR calc non Af Amer: >60 mL/min (ref >60)
Glucose, Bld: 142 mg/dL — ABNORMAL HIGH (ref 70–99)
Potassium: 4.3 mmol/L (ref 3.5–5.1)
Sodium: 137 mmol/L (ref 135–145)

## 2019-10-15 LAB — HIV ANTIBODY (ROUTINE TESTING W REFLEX): HIV Screen 4th Generation wRfx: NONREACTIVE

## 2019-10-15 MED ORDER — SODIUM CHLORIDE 0.9 % IV BOLUS
500.0000 mL | Freq: Once | INTRAVENOUS | Status: AC
  Administered 2019-10-15: 06:00:00 500 mL via INTRAVENOUS

## 2019-10-15 NOTE — Discharge Summary (Signed)
[facility]/Trauma Discharge Summary   Patient ID: Liany Mumpower MRN: [id number] DOB/AGE: [birth year] 46 y.o.  Admit date: 10/14/2019 Discharge date: 10/15/2019  Admitting Diagnosis: Appendicitis   Discharge Diagnosis Patient Active Problem List   Diagnosis Date Noted  . Acute appendicitis 10/14/2019  . Family history of consanguinity 05/11/2017  . 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 Language barrier, cultural differences 04/12/2017    Consultants none  Imaging: Ct Abdomen Pelvis W Contrast  Result Date: 10/14/2019 CLINICAL DATA:  Acute onset lower abdominal pain this morning. EXAM: CT ABDOMEN AND PELVIS WITH CONTRAST TECHNIQUE: Multidetector CT imaging of the abdomen and pelvis was performed using the standard protocol following bolus administration of intravenous contrast. CONTRAST:  146mL OMNIPAQUE IOHEXOL 300 MG/ML  SOLN COMPARISON:  None. FINDINGS: Lower chest: Mild dependent atelectasis bilaterally. Trace bilateral pleural fluid. Heart size normal. No pericardial effusion. Hepatobiliary: Liver and gallbladder are unremarkable. No biliary ductal dilatation. Pancreas: Negative. Spleen: Negative. Adrenals/Urinary Tract: Adrenal glands and kidneys are unremarkable. Ureters are decompressed. Bladder is grossly unremarkable. Stomach/Bowel: Stomach and small bowel are unremarkable. The appendix is dilated and fluid-filled, measuring up to 11 mm near the tip. No appendicoliths. Mild periappendiceal inflammatory stranding. Colon is unremarkable. Vascular/Lymphatic: Vascular structures are unremarkable. No pathologically enlarged lymph nodes. Reproductive: Uterus is visualized. Prominent pelvic veins bilaterally. No adnexal mass. Other: No free fluid. Mesenteries and peritoneum are otherwise unremarkable. Musculoskeletal: None. IMPRESSION: 1. Acute appendicitis without rupture or abscess. 2. Trace bilateral pleural fluid. Electronically Signed   By: Lorin Picket M.D.   On: 10/14/2019 13:46    Procedures Dr. Kae Heller (10/14/19) - Laparoscopic Appendectomy  HPI: History obtained with video interpreter, patient speaks Arabic, she is from [place].  This is a otherwise healthy 48 year old woman who developed abdominal pain yesterday.  This gradually worsened and localized to the right lower quadrant.  This has been associated with nausea and several episodes of emesis.  She has not had prior similar symptoms.  No fevers. No prior abdominal surgeries.  Hospital Course:  Workup showed appendicitis.  Patient was admitted and underwent procedure listed above.  Tolerated procedure well and was transferred to the floor.  Diet was advanced as tolerated.  On POD#1, the patient was voiding well, tolerating diet, ambulating well, pain well controlled, vital signs stable, incisions c/d/i and felt stable for discharge home.  Patient will follow up as outlined below and knows to call with questions or concerns. Arabic interpreter, Safi with stratus, assisted with interpretation. Pt lives with family.     Patient was discharged in good condition.  The [place] Substance controlled database was reviewed prior to prescribing narcotic pain medication to this patient.  Physical Exam: General:  Alert, NAD, pleasant, cooperative Cardio: RRR, S1 & S2 normal, no murmur, rubs, gallops Resp: rate and effort normal, lungs CTA bilaterally, no wheezes, rales, rhonchi Abd:  Soft, ND, normal bowel sounds, incisions with glue intact appear well and are without drainage, bleeding or signs of infection. Mild TTP around incisions without guarding. No peritonitis Skin: warm and dry, no rashes noted  Allergies as of 10/15/2019   No Known Allergies     Medication List    STOP taking these medications   amoxicillin-clavulanate 875-125 MG tablet Commonly known as: AUGMENTIN     TAKE these medications   docusate sodium 100 MG capsule Commonly known as: Colace Take 1  capsule (100 mg total) by mouth 2 (two) times daily.   traMADol 50 MG tablet Commonly known  as: Ultram Take 1 tablet (50 mg total) by mouth every 6 (six) hours as needed. Try tylenol and ibuprofen first. Use this medication for pain not relieved by OTC medications.        Follow-up Information    [facility]. Call.   Specialty: General Surgery Why: Our office is working to schedule a follow up appointment for you in about 3 weeks. Please call to confirm appointment date/time. Arrive 30 min prior to appointment time. Bring photo ID and insurance information.  Contact information: [address] [place] [address] [phone]           Signed: Joyce Copa [facility] 10/15/2019, 8:21 AM Please see amion for pager for the following: M, T, W, & Friday 7:00am - 4:30pm Thursdays 7:00am -11:30am

## 2019-10-15 NOTE — Progress Notes (Signed)
Blood pressure 93/51  Hr 64. Dr. Grandville Silos ordered V bolus NS 500 ML

## 2019-10-15 NOTE — Anesthesia Postprocedure Evaluation (Signed)
Anesthesia Post Note  Patient: Danielle Bridges  Procedure(s) Performed: APPENDECTOMY LAPAROSCOPIC (N/A )     Patient location during evaluation: PACU Anesthesia Type: General Level of consciousness: sedated and patient cooperative Pain management: pain level controlled Vital Signs Assessment: post-procedure vital signs reviewed and stable Respiratory status: spontaneous breathing Cardiovascular status: stable Anesthetic complications: no    Last Vitals:  Vitals:   10/15/19 0209 10/15/19 0544  BP: (!) 95/54 (!) 93/51  Pulse: 73 79  Resp: 16 16  Temp: 36.7 C 36.7 C  SpO2: 100% 100%    Last Pain:  Vitals:   10/15/19 0654  TempSrc:   PainSc: [place]

## 2019-10-16 LAB — SURGICAL PATHOLOGY

## 2020-02-15 ENCOUNTER — Ambulatory Visit: Payer: BLUE CROSS/BLUE SHIELD

## 2020-02-22 ENCOUNTER — Ambulatory Visit: Payer: Self-pay | Attending: Internal Medicine

## 2020-02-22 DIAGNOSIS — Z23 Encounter for immunization: Secondary | ICD-10-CM

## 2020-02-22 NOTE — Progress Notes (Signed)
   Covid-19 Vaccination Clinic  Name:  Keiera Strathman    MRN: [id number] DOB: 1971/10/05  02/22/2020  Ms. Stief was observed post Covid-19 immunization for 15 minutes without incident. She was provided with Vaccine Information Sheet and instruction to access the V-Safe system.   Ms. Merrick was instructed to call 911 with any severe reactions post vaccine: Marland Kitchen Difficulty breathing  . Swelling of face and throat  . A fast heartbeat  . A bad rash all over body  . Dizziness and weakness   Immunizations Administered    Name Date Dose VIS Date Route   Moderna COVID-19 Vaccine 02/22/2020 11:06 AM 0.5 mL 10/22/2019 Intramuscular   Manufacturer: Moderna   Lot: [id number]   NDC: 22979-892-11

## 2020-03-21 ENCOUNTER — Ambulatory Visit: Payer: Self-pay | Attending: Critical Care Medicine

## 2020-03-21 DIAGNOSIS — Z23 Encounter for immunization: Secondary | ICD-10-CM

## 2020-03-21 NOTE — Progress Notes (Signed)
   Covid-19 Vaccination Clinic  Name:  Danielle Bridges    MRN: [id number] DOB: 12/08/1970  03/21/2020  [name] was observed post Covid-19 immunization for 15 minutes without incident. She was provided with Vaccine Information Sheet and instruction to access the V-Safe system.   [name] was instructed to call 911 with any severe reactions post vaccine: Marland Kitchen Difficulty breathing  . Swelling of face and throat  . A fast heartbeat  . A bad rash all over body  . Dizziness and weakness   Immunizations Administered    Name Date Dose VIS Date Route   Moderna COVID-19 Vaccine 03/21/2020 10:48 AM 0.5 mL 10/2019 Intramuscular   Manufacturer: Moderna   Lot: [id number]   NDC: 06004-599-77

## 2020-03-31 ENCOUNTER — Other Ambulatory Visit: Payer: Self-pay

## 2020-03-31 ENCOUNTER — Ambulatory Visit (INDEPENDENT_AMBULATORY_CARE_PROVIDER_SITE_OTHER): Payer: Self-pay | Admitting: Family Medicine

## 2020-03-31 VITALS — BP 100/66 | HR 80 | Ht 62.8 in | Wt 156.2 lb

## 2020-03-31 DIAGNOSIS — Z7184 Encounter for health counseling related to travel: Secondary | ICD-10-CM

## 2020-03-31 DIAGNOSIS — R5382 Chronic fatigue, unspecified: Secondary | ICD-10-CM

## 2020-03-31 MED ORDER — TYPHOID VACCINE PO CPDR: 1.0000 | DELAYED_RELEASE_CAPSULE | ORAL | 0 refills | Status: AC

## 2020-03-31 MED ORDER — ATOVAQUONE-PROGUANIL HCL 250-100 MG PO TABS: ORAL_TABLET | ORAL | 0 refills | Status: AC

## 2020-03-31 NOTE — Progress Notes (Signed)
SUBJECTIVE:   CHIEF COMPLAINT / HPI:   Danielle Bridges is a pleasant 49-year-old woman with history significant for overweight status presenting today for establishing care and discuss travel to [place].  She reports her only concern today is fatigue.  This is been ongoing for several years.  She has intermittent headaches which are bilateral in nature.  She denies palpitations, chest pain, history of thyroid problems, changes in temperature.  She reports her sleep is poor because she wakes up at night worrying about her children.  She is constantly worrying by her family.  She denies low mood.  She reports overall she is happy she denies thoughts of hurting herself or others.  She reports she is looking forward to travel to [place].  She lays May 25.  She returns August 25.  She is [place] staying in the capital city.  She is not doing any specific activities outside of the city   PERTINENT  PMH / PSH/Family/Social History :   Medical history notable for the following: Overweight status Surgical history notable for appendectomy and tonsillectomy  Family history reviewed Obstetric and GYN history Prior history of chlamydia Had a normal Pap in 2018 She is not using anything for contraception which we discussed 4 prior vaginal deliveries without complication 1 spontaneous abortion  OBJECTIVE:   BP 100/66   Pulse 80   Ht 5' 2.8" (1.595 m)   Wt 156 lb 3.2 oz (70.9 kg)   SpO2 99%   BMI 27.85 kg/m   HEENT: Sclera anicteric. Dentition is moderate. Appears well hydrated. Neck: Supple Cardiac: Regular rate and rhythm. Normal S1/S2. No murmurs, rubs, or gallops appreciated. Lungs: Clear bilaterally to ascultation.  Abdomen: Normoactive bowel sounds. No tenderness to deep or light palpation. No rebound or guarding.  Extremities: Warm, well perfused without edema.  Skin: Warm, dry Psych: Pleasant and appropriate     ASSESSMENT/PLAN:   Danielle Bridges was seen today for travel consult.  Diagnoses  and all orders for this visit:  Travel advice encounter      - Reviewed CDC guidance. Discussed the following as relevant: vaccine preventable disease including Hep A, Hep B, cholera, yellow fever, MMR. Discussed COVID precautions. Malaria prophylaxis as indicated. Recommended avoiding animals, drinking only boiled water, general safety precautions, and avoidance of bug bites. Orders as below. Discussed recommended to avoid travel at this time given COVID19 pandemic, discussed risks.  -     typhoid (VIVOTIF) DR capsule; Take 1 capsule by mouth every other day. Start on Saturday 5/15 -     atovaquone-proguanil (MALARONE) 250-100 MG TABS tablet; Take 1 tablet every day starting May 23, every day in [place], and for 1 week thereafter  Chronic fatigue, most likely cause of this condition is actually generalized anxiety but differential does include anemia, thyroid disease obstructive sleep apnea.  Also considered tension type headaches or migraine headaches although her headache symptoms are less likely given the presentation.  We discussed evaluation of this she would like to discuss further at follow-up in the fall  At follow-up she also needs a repeat chlamydia test.  I discussed this with her today.  Danielle Brown, MD  Family Medicine Teaching Service  [facility]    

## 2020-03-31 NOTE — Patient Instructions (Addendum)
It was wonderful to see you today.  Please bring ALL of your medications with you to every visit.   Thank you for choosing [facility].   Please call [phone] with any questions about today's appointment.  Please be sure to schedule follow up at the front  desk before you leave today.   Dorris Singh, MD  Family Medicine   I will send a request to [facility] for dental and medical coverage   - Start Typhoid on Saturday and take every other day  Bring your vaccine records in this week so we can copy them please    Learn actions you can take to stay healthy and safe on your trip. Vaccines cannot protect you from many diseases in [place], so your behaviors are important.     Hide Eat and drink safely Unclean food and water can cause travelers' diarrhea and other diseases. Reduce your risk by sticking to safe food and water habits.  Eat Food that is cooked and served hot Hard-cooked eggs Fruits and vegetables you have washed in clean water or peeled yourself Pasteurized dairy products [place] served at room temperature Food from street vendors Raw or soft-cooked (runny) eggs Raw or undercooked (rare) meat or fish Unwashed or unpeeled raw fruits and vegetables Unpasteurized dairy products "Bushmeat" (monkeys, bats, or other wild game) Drink Bottled water that is sealed Water that has been disinfected Ice made with bottled or disinfected water Carbonated drinks Hot coffee or tea Pasteurized milk Don't Drink Tap or well water Ice made with tap or well water Drinks made with tap or well water (such as reconstituted juice) Unpasteurized milk Take Medicine Talk with your doctor about taking prescription or over-the-counter drugs with you on your trip in case you get sick.  Hide Hide Prevent bug bites Bugs (like mosquitoes, ticks, and fleas) can spread a number of diseases in [place]. Many of these diseases cannot be prevented with a  vaccine or medicine. You can reduce your risk by taking steps to prevent bug bites.  What can I do to prevent bug bites? Cover exposed skin by wearing long-sleeved shirts, long pants, and hats. Use an appropriate insect repellent (see below). Use permethrin-treated clothing and gear (such as boots, pants, socks, and tents). Do not use permethrin directly on skin. Stay and sleep in air-conditioned or screened rooms. Use a bed net if the area where you are sleeping is exposed to the outdoors. What type of insect repellent should I use? FOR PROTECTION AGAINST TICKS AND MOSQUITOES: Use a repellent that contains 20% or more DEET for protection that lasts up to several hours. FOR PROTECTION AGAINST MOSQUITOES ONLY: Products with one of the following active ingredients can also help prevent mosquito bites. Higher percentages of active ingredient provide longer protection. DEET Picaridin (also known as KBR 3023, Bayrepel, and icaridin) Oil of lemon eucalyptus (OLE) or para-menthane-diol (PMD) IR3535 [id number]-undecanone Always use insect repellent as directed. What should I do if I am bitten by bugs? Avoid scratching bug bites, and apply hydrocortisone cream or calamine lotion to reduce the itching. Check your entire body for ticks after outdoor activity. Be sure to remove ticks properly. What can I do to avoid bed bugs? Although bed bugs do not carry disease, they are an annoyance. See our information page about avoiding bug bites for some easy tips to avoid them. For more information on bed bugs, see Bed Bugs.  For more detailed information on avoiding bug bites, see  Avoid Bug Bites.  Hide Hide Stay safe outdoors If your travel plans in [place] include outdoor activities, take these steps to stay safe and healthy during your trip.  Stay alert to changing weather conditions and adjust your plans if conditions become unsafe. Prepare for activities by wearing the right clothes and packing protective  items, such as bug spray, sunscreen, and a basic first aid kit. Consider learning basic first aid and CPR before travel. Bring a travel health kit with items appropriate for your activities. Heat-related illness, such as heat stroke, can be deadly. Eat and drink regularly, wear loose and lightweight clothing, and limit physical activity during high temperatures. If you are outside for many hours in heat, eat salty snacks and drink water to stay hydrated and replace salt lost through sweating. Protect yourself from UV radiation: use sunscreen with an SPF of at least 15, wear protective clothing, and seek shade during the hottest time of day (10 a.m.-4 p.m.). Be especially careful during summer months and at high elevation. Because sunlight reflects off snow, sand, and water, sun exposure may be increased during activities like skiing, swimming, and sailing. Very cold temperatures can be dangerous. Dress in layers and cover heads, hands, and feet properly if you are visiting a cold location. Stay safe around water Swim only in designated swimming areas. Obey lifeguards and warning flags on beaches. Practice safe boating--follow all boating safety laws, do not drink alcohol if driving a boat, and always wear a life jacket. Do not dive into shallow water. Do not swim in freshwater in developing areas or where sanitation is poor. Avoid swallowing water when swimming. Untreated water can carry germs that make you sick. To prevent infections, wear shoes on beaches where there may be animal waste. Schistosomiasis, a parasitic infection that can be spread in fresh water, is found in [place]. Avoid swimming in fresh, unchlorinated water, such as lakes, ponds, or rivers.  Hide Hide Keep away from animals Most animals avoid people, but they may attack if they feel threatened, are protecting their young or territory, or if they are injured or ill. Animal bites and scratches can lead to serious diseases such as  rabies.  Follow these tips to protect yourself:  Do not touch or feed any animals you do not know. Do not allow animals to lick open wounds, and do not get animal saliva in your eyes or mouth. Avoid rodents and their urine and feces. Traveling pets should be supervised closely and not allowed to come in contact with local animals. If you wake in a room with a bat, seek medical care immediately. Bat bites may be hard to see. All animals can pose a threat, but be extra careful around dogs, bats, monkeys, sea animals such as jellyfish, and snakes. If you are bitten or scratched by an animal, immediately:  Wash the wound with soap and clean water. Go to a doctor right away. Tell your doctor about your injury when you get back to the [place]. Consider buying medical evacuation insurance. Rabies is a deadly disease that must be treated quickly, and treatment may not be available in some countries.  Hide Hide Reduce your exposure to germs Follow these tips to avoid getting sick or spreading illness to others while traveling:  Wash your hands often, especially before eating. If soap and water aren't available, clean hands with hand sanitizer (containing at least 60% alcohol). Don't touch your eyes, nose, or mouth. If you need to touch your face,  make sure your hands are clean. Cover your mouth and nose with a tissue or your sleeve (not your hands) when coughing or sneezing. Try to avoid contact with people who are sick. If you are sick, stay home or in your hotel room, unless you need medical care. Hide Hide Avoid sharing body fluids Diseases can be spread through body fluids, such as saliva, blood, vomit, and semen.  Protect yourself:  Use latex condoms correctly. Do not inject drugs. Limit alcohol consumption. People take more risks when intoxicated. Do not share needles or any devices that can break the skin. That includes needles for tattoos, piercings, and acupuncture. If you  receive medical or dental care, make sure the equipment is disinfected or sanitized. Hide Hide Know how to get medical care while traveling Plan for how you will get health care during your trip, should the need arise:  Carry a list of local doctors and hospitals at your destination. Review your health insurance plan to determine what medical services it would cover during your trip. Consider purchasing travel health and medical evacuation insurance. Carry a card that identifies, in the local language, your blood type, chronic conditions or serious allergies, and the generic names of any medications you take. Some prescription drugs may be illegal in other countries. Call [place]'s embassy to verify that all of your prescription(s) are legal to bring with you. Bring all the medicines (including over-the-counter medicines) you think you might need during your trip, including extra in case of travel delays. Ask your doctor to help you get prescriptions filled early if you need to. Many foreign hospitals and clinics are accredited by the [employer]. A list of accredited facilities is available at their website (Ellustrate.fi).  In some countries, medicine (prescription and over-the-counter) may be substandard or counterfeit. Bring the medicines you will need from the [place] States to avoid having to buy them at your destination.  Malaria is a risk in [place]. Fill your malaria prescription before you leave and take enough with you for the entire length of your trip. Follow your doctor's instructions for taking the pills; some need to be started before you leave.  Hide [employer] safe transportation Motor vehicle crashes are the #1 killer of healthy [place] citizens in foreign countries.  In many places cars, buses, large trucks, rickshaws, bikes, people on foot, and even animals share the same lanes of traffic, increasing the risk for crashes.  Walking Be  smart when you are traveling on foot.  Use sidewalks and marked crosswalks. Pay attention to the traffic around you, especially in crowded areas. Remember, people on foot do not always have the right of way in other countries. Riding/Driving Choose a safe vehicle.  Choose official taxis or public transportation, such as trains and buses. Ride only in cars that have seatbelts. Avoid overcrowded, overloaded, top-heavy buses and minivans. Avoid riding on motorcycles or motorbikes, especially motorbike taxis. (Many crashes are caused by inexperienced motorbike drivers.) Choose newer vehicles--they may have more safety features, such as airbags, and be more reliable. Choose larger vehicles, which may provide more protection in crashes. Think about the driver.  Do not drive after drinking alcohol or ride with someone who has been drinking. Consider hiring a licensed, trained driver familiar with the area. Arrange payment before departing. Follow basic safety tips.  Wear a seatbelt at all times. Sit in the back seat of cars and taxis. When on motorbikes or bicycles, always wear a helmet. (Bring a helmet from  home, if needed.) Avoid driving at night; street lighting in certain parts of [place] may be poor. Do not use a cell phone or text while driving (illegal in many countries). Travel during daylight hours only, especially in rural areas. If you choose to drive a vehicle in [place], learn the local traffic laws and have the proper paperwork. Get any driving permits and insurance you may need. Get an Public librarian (IDP). Carry the IDP and a [place]-issued driver's license at all times. Check with your auto insurance policy's international coverage, and get more coverage if needed. Make sure you have liability insurance. Flying Avoid using local, unscheduled aircraft. If possible, fly on larger planes (more than 30 seats); larger airplanes are more likely to have regular safety  inspections. Try to schedule flights during daylight hours and in good weather. Medical Evacuation Insurance If you are seriously injured, emergency care may not be available or may not meet [place] standards. Trauma care centers are uncommon outside urban areas. Having medical evacuation insurance can be helpful for these reasons.  Helpful Resources Road Building control surveyor (Information from the [place] Department of State): Includes tips on driving in other countries, Cytogeneticist, Academic librarian insurance, and other resources.  The [employer] for [employer] has country-specific Road Travel Reports available for most countries for a minimal fee.  Hide [employer] personal security Use the same common sense traveling overseas that you would at home, and always stay alert and aware of your surroundings.  Before you leave Research your destination(s), including local laws, customs, and culture. Monitor travel advisories and alerts and read travel tips from the [place] Department of State. Enroll in the [employer] Enrollment Program (STEP). Leave a copy of your itinerary, contact information, credit cards, and passport with someone at home. Pack as light as possible, and leave at home any item you could not replace. While at your destination(s) Carry contact information for the nearest [place] embassy or consulate. Carry a Engineer, materials of your passport and entry stamp; leave the actual passport securely in your hotel. Follow all local laws and social customs. Do not wear expensive clothing or jewelry. Always keep hotel doors locked, and store valuables in secure areas. If possible, choose hotel rooms between the 2nd and 6th floors. Hide ? Top  [employer] List Use the Healthy Travel Packing List for [place] for a list of health-related items to consider packing for your trip. Talk to your doctor about which items are most important for you.  Why does CDC recommend  packing these health-related items? It's best to be prepared to prevent and treat common illnesses and injuries. Some supplies and medicines may be difficult to find at your destination, may have different names, or may have different ingredients than what you normally use.  ? Top  Hide After Your Trip If you are not feeling well after your trip, you may need to see a doctor. If you need help finding a travel medicine specialist, see Find a Clinic. Be sure to tell your doctor about your travel, including where you went and what you did on your trip. Also tell your doctor if you were bitten or scratched by an animal while traveling.  If your doctor prescribed antimalarial medicine for your trip, keep taking the rest of your pills after you return home. If you stop taking your medicine too soon, you could still get sick.  Malaria is always a serious disease and may be a deadly illness. If  you become ill with a fever either while traveling in a malaria-risk area or after you return home (for up to 1 year), you should seek immediate medical attention and should tell the doctor about your travel history.  For more information on what to do if you are sick after your trip, see Getting Sick after Travel.
